# Patient Record
Sex: Male | Born: 1950 | Race: Black or African American | Hispanic: No | Marital: Single
Health system: Southern US, Community
[De-identification: ages and names within clinical notes are randomized; demographics above are authoritative.]

---

## 2008-08-09 ENCOUNTER — Inpatient Hospital Stay: Payer: Self-pay | Admitting: Internal Medicine

## 2008-09-15 ENCOUNTER — Emergency Department: Payer: Self-pay | Admitting: Emergency Medicine

## 2008-11-11 ENCOUNTER — Ambulatory Visit: Payer: Self-pay | Admitting: Nephrology

## 2009-03-31 ENCOUNTER — Ambulatory Visit: Payer: Self-pay | Admitting: Nephrology

## 2009-03-31 ENCOUNTER — Emergency Department: Payer: Self-pay | Admitting: Emergency Medicine

## 2009-04-08 ENCOUNTER — Ambulatory Visit: Payer: Self-pay | Admitting: Nephrology

## 2009-04-22 ENCOUNTER — Ambulatory Visit: Payer: Self-pay | Admitting: Vascular Surgery

## 2009-05-05 ENCOUNTER — Emergency Department: Payer: Self-pay | Admitting: Unknown Physician Specialty

## 2009-06-10 ENCOUNTER — Ambulatory Visit: Payer: Self-pay | Admitting: Vascular Surgery

## 2009-06-23 ENCOUNTER — Inpatient Hospital Stay: Payer: Self-pay | Admitting: Internal Medicine

## 2009-07-24 ENCOUNTER — Emergency Department: Payer: Self-pay | Admitting: Emergency Medicine

## 2009-08-06 ENCOUNTER — Emergency Department: Payer: Self-pay | Admitting: Emergency Medicine

## 2009-08-09 ENCOUNTER — Ambulatory Visit: Payer: Self-pay | Admitting: Emergency Medicine

## 2009-08-22 ENCOUNTER — Inpatient Hospital Stay: Payer: Self-pay | Admitting: Internal Medicine

## 2009-09-09 ENCOUNTER — Ambulatory Visit: Payer: Self-pay | Admitting: Orthopedic Surgery

## 2009-11-05 ENCOUNTER — Inpatient Hospital Stay: Payer: Self-pay | Admitting: Internal Medicine

## 2009-11-15 ENCOUNTER — Inpatient Hospital Stay: Payer: Self-pay | Admitting: Internal Medicine

## 2009-11-22 ENCOUNTER — Ambulatory Visit: Payer: Self-pay | Admitting: Nephrology

## 2009-12-16 ENCOUNTER — Ambulatory Visit: Payer: Self-pay | Admitting: Podiatry

## 2010-01-03 ENCOUNTER — Ambulatory Visit: Payer: Self-pay | Admitting: Podiatry

## 2010-01-13 ENCOUNTER — Ambulatory Visit: Payer: Self-pay | Admitting: Internal Medicine

## 2010-01-18 ENCOUNTER — Ambulatory Visit: Payer: Self-pay | Admitting: Podiatry

## 2010-01-19 ENCOUNTER — Ambulatory Visit: Payer: Self-pay | Admitting: Podiatry

## 2010-01-27 ENCOUNTER — Inpatient Hospital Stay: Payer: Self-pay | Admitting: Internal Medicine

## 2010-03-18 ENCOUNTER — Inpatient Hospital Stay: Payer: Self-pay | Admitting: Internal Medicine

## 2010-05-08 ENCOUNTER — Emergency Department: Payer: Self-pay | Admitting: Internal Medicine

## 2010-05-09 ENCOUNTER — Inpatient Hospital Stay: Payer: Self-pay | Admitting: Internal Medicine

## 2010-05-24 ENCOUNTER — Ambulatory Visit: Payer: Self-pay | Admitting: Nephrology

## 2010-05-26 ENCOUNTER — Inpatient Hospital Stay: Payer: Self-pay | Admitting: Specialist

## 2010-06-08 ENCOUNTER — Ambulatory Visit: Payer: Self-pay

## 2010-06-15 ENCOUNTER — Ambulatory Visit: Payer: Self-pay

## 2010-06-15 LAB — PATHOLOGY REPORT

## 2010-06-23 ENCOUNTER — Ambulatory Visit: Payer: Self-pay

## 2010-06-28 ENCOUNTER — Ambulatory Visit: Payer: Self-pay

## 2010-07-05 ENCOUNTER — Ambulatory Visit: Payer: Self-pay

## 2010-07-14 ENCOUNTER — Ambulatory Visit: Payer: Self-pay

## 2010-07-26 ENCOUNTER — Ambulatory Visit: Payer: Self-pay

## 2010-08-02 ENCOUNTER — Ambulatory Visit: Payer: Self-pay

## 2010-08-22 ENCOUNTER — Encounter: Payer: Self-pay | Admitting: Orthopedic Surgery

## 2010-08-23 ENCOUNTER — Ambulatory Visit: Payer: Self-pay

## 2010-08-30 ENCOUNTER — Ambulatory Visit: Payer: Self-pay

## 2010-09-08 ENCOUNTER — Encounter: Payer: Self-pay | Admitting: Orthopedic Surgery

## 2010-09-13 ENCOUNTER — Ambulatory Visit: Payer: Self-pay

## 2010-09-15 ENCOUNTER — Ambulatory Visit: Payer: Self-pay | Admitting: Internal Medicine

## 2010-09-29 ENCOUNTER — Ambulatory Visit: Payer: Self-pay

## 2010-10-05 ENCOUNTER — Ambulatory Visit: Payer: Self-pay

## 2010-10-13 ENCOUNTER — Encounter: Payer: Self-pay | Admitting: Orthopedic Surgery

## 2010-10-31 ENCOUNTER — Ambulatory Visit: Payer: Self-pay | Admitting: Vascular Surgery

## 2010-11-09 ENCOUNTER — Encounter: Payer: Self-pay | Admitting: Orthopedic Surgery

## 2010-12-05 ENCOUNTER — Ambulatory Visit: Payer: Self-pay | Admitting: Nephrology

## 2010-12-21 ENCOUNTER — Emergency Department: Payer: Self-pay | Admitting: Unknown Physician Specialty

## 2011-02-07 ENCOUNTER — Ambulatory Visit: Payer: Self-pay | Admitting: Urology

## 2011-03-29 ENCOUNTER — Ambulatory Visit: Payer: Self-pay | Admitting: Surgery

## 2011-04-10 ENCOUNTER — Encounter: Payer: Self-pay | Admitting: Nurse Practitioner

## 2011-04-13 ENCOUNTER — Inpatient Hospital Stay: Payer: Self-pay | Admitting: Internal Medicine

## 2011-04-28 ENCOUNTER — Ambulatory Visit: Payer: Self-pay | Admitting: Surgery

## 2011-05-09 ENCOUNTER — Inpatient Hospital Stay: Payer: Self-pay | Admitting: Surgery

## 2011-05-22 ENCOUNTER — Inpatient Hospital Stay: Payer: Self-pay | Admitting: Internal Medicine

## 2011-07-03 ENCOUNTER — Ambulatory Visit: Payer: Self-pay | Admitting: Vascular Surgery

## 2011-07-04 ENCOUNTER — Other Ambulatory Visit: Payer: Self-pay | Admitting: Nephrology

## 2011-07-06 ENCOUNTER — Ambulatory Visit: Payer: Self-pay | Admitting: Vascular Surgery

## 2011-08-03 ENCOUNTER — Ambulatory Visit: Payer: Self-pay | Admitting: Internal Medicine

## 2011-08-14 ENCOUNTER — Inpatient Hospital Stay: Payer: Self-pay | Admitting: Surgery

## 2011-08-16 LAB — PATHOLOGY REPORT

## 2011-09-04 IMAGING — CR DG ELBOW COMPLETE 3+V*L*
1 series · 4 of 4 positions shown · non-contrast
Comparison: none

REASON FOR EXAM: pain/trauma
COMMENTS:

PROCEDURE:     DXR - DXR ELBOW LT COMP W/OBLIQUES  - November 08, 2009  [DATE]
RESULT:     There is no radiographic evidence of overt fracture or
dislocation. Mild anterior and posterior fat pad signs are appreciated which
suggests effusion. A radio-occult fracture cannot be excluded.

[Series 1: view not recorded · 0.17mm/px · 4 of 4 slices shown]
[im 1/4]
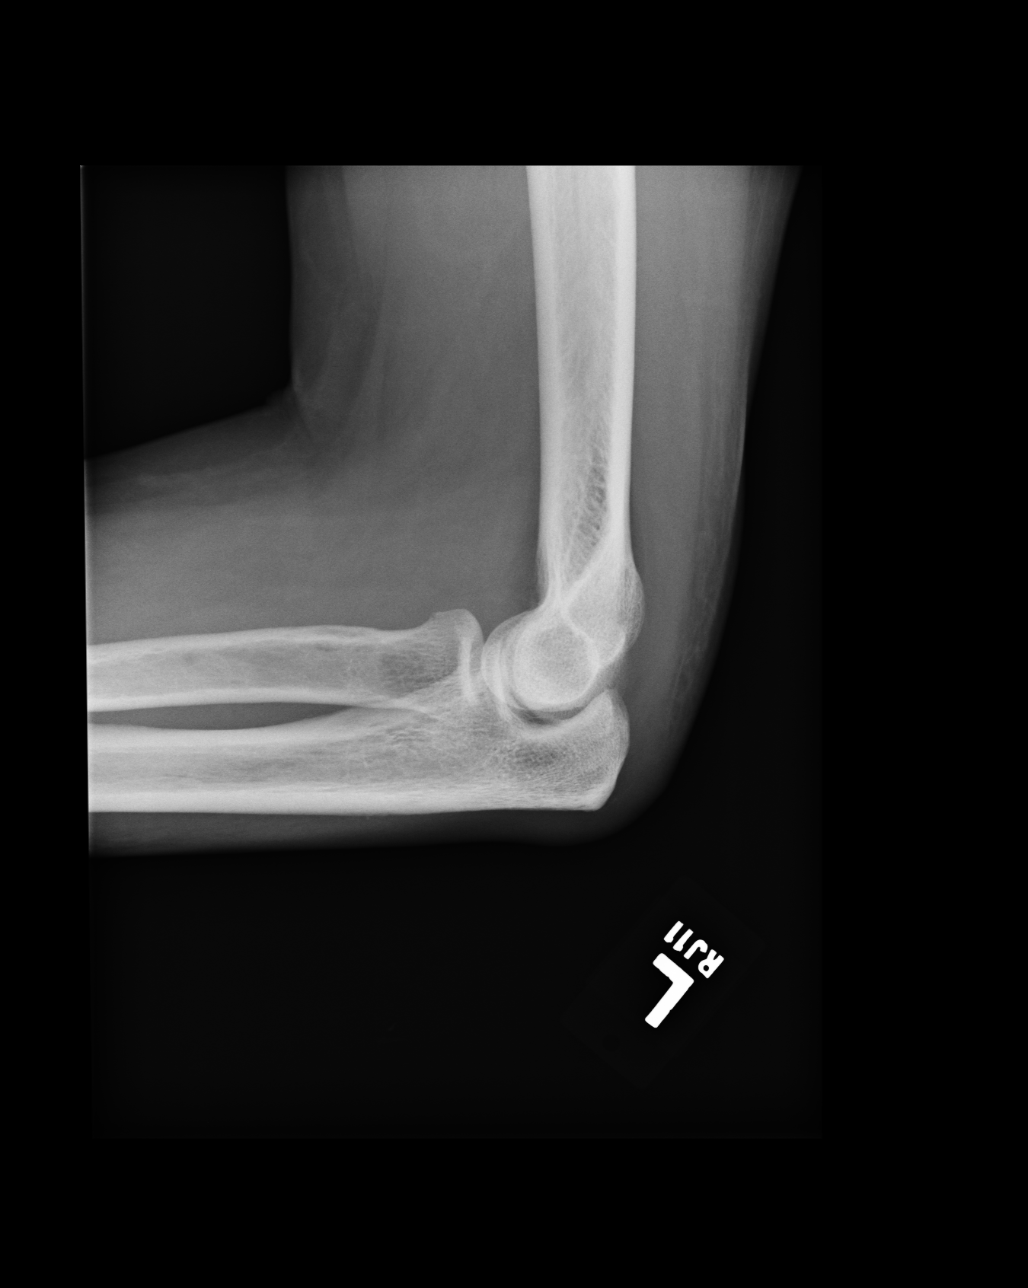
[im 2/4]
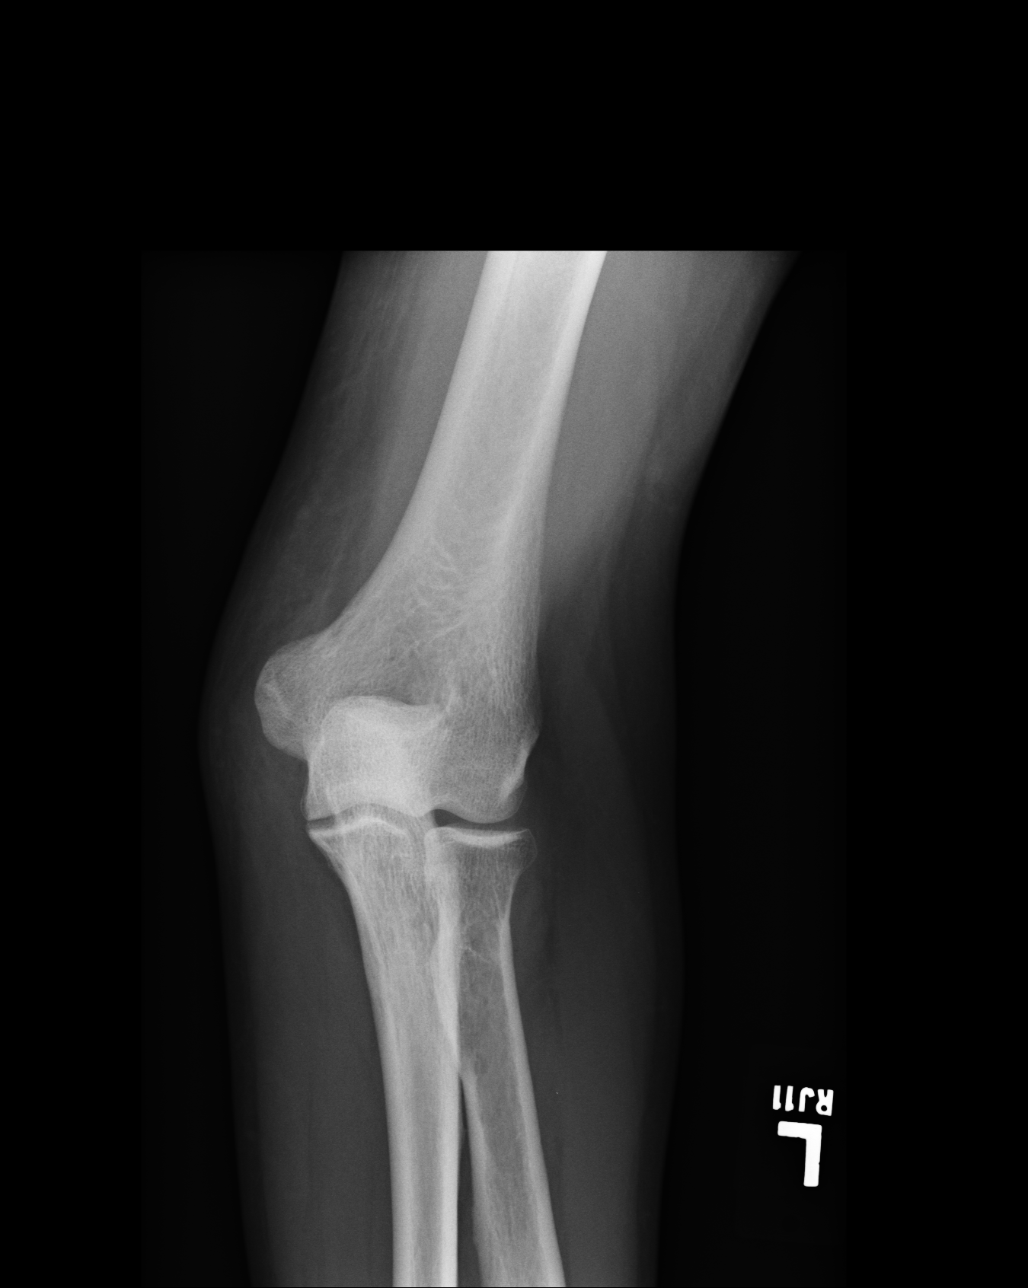
[im 3/4]
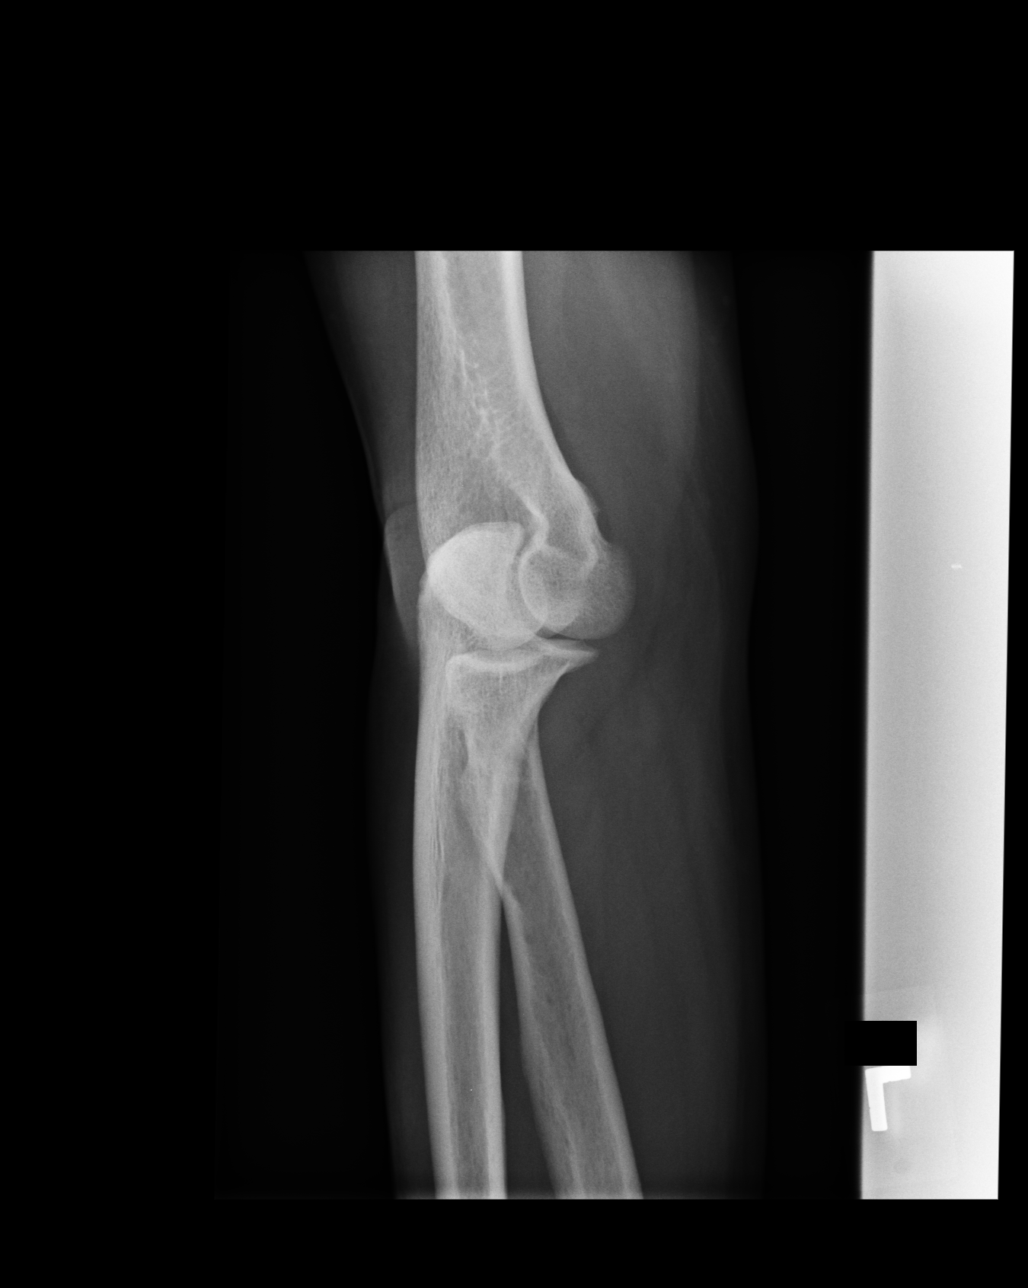
[im 4/4]
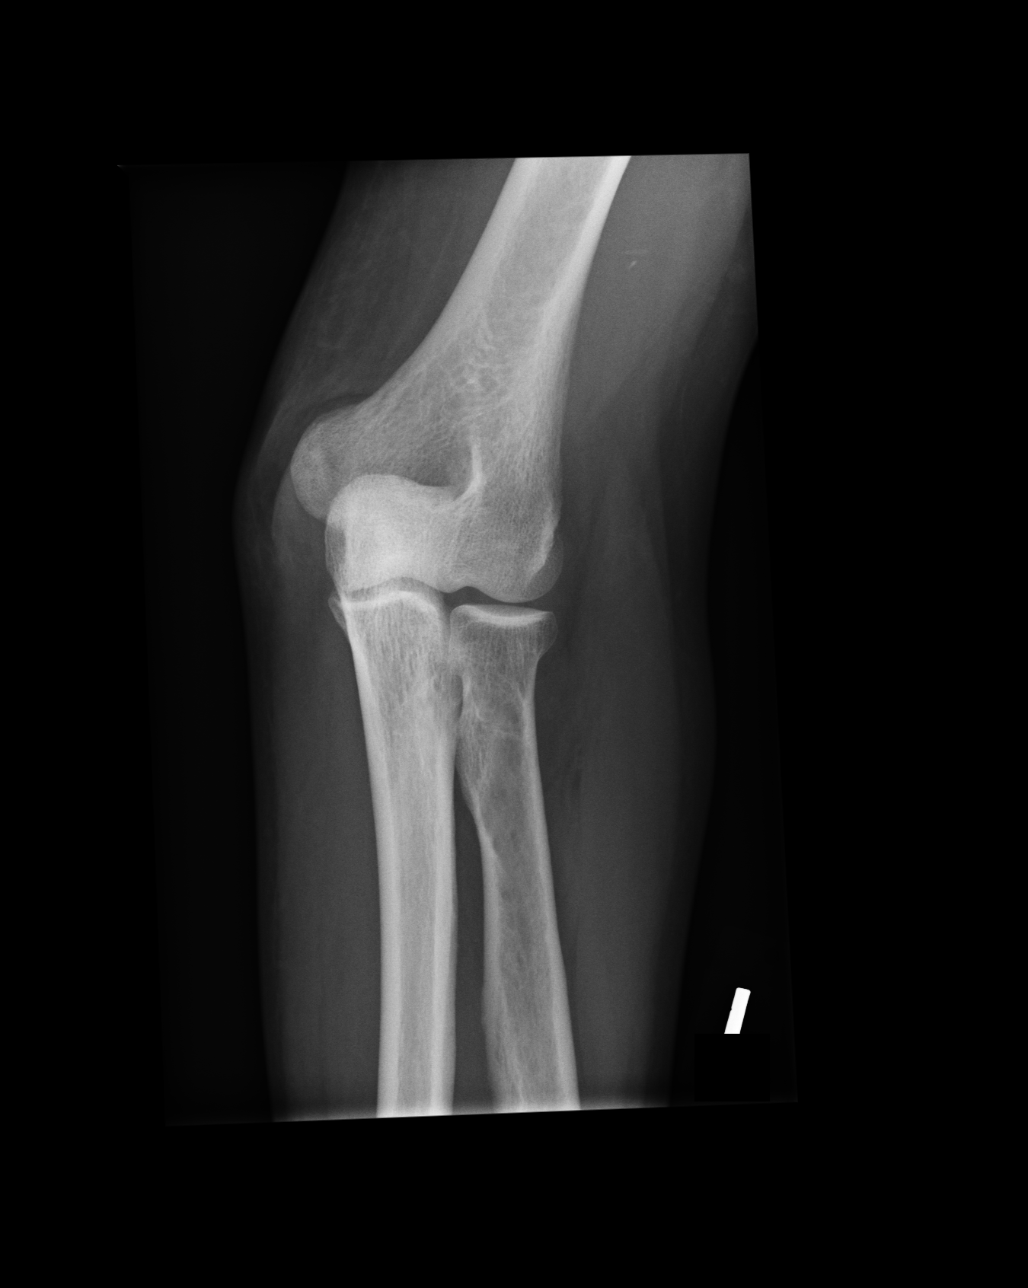

[4 of 4 positions shown; findings below may reference images not displayed]

IMPRESSION: No evidence of a radio-overt fracture. There does appear to
be findings consistent with a small joint effusion and a radio-occult
fracture cannot be excluded. If clinically warranted, repeat evaluation in
7-10 days is recommended.

## 2011-09-08 ENCOUNTER — Encounter: Payer: Self-pay | Admitting: Cardiothoracic Surgery

## 2011-09-08 ENCOUNTER — Encounter: Payer: Self-pay | Admitting: Nurse Practitioner

## 2011-09-09 ENCOUNTER — Encounter: Payer: Self-pay | Admitting: Nurse Practitioner

## 2011-09-09 ENCOUNTER — Encounter: Payer: Self-pay | Admitting: Cardiothoracic Surgery

## 2011-10-10 ENCOUNTER — Encounter: Payer: Self-pay | Admitting: Nurse Practitioner

## 2011-10-10 ENCOUNTER — Encounter: Payer: Self-pay | Admitting: Cardiothoracic Surgery

## 2011-10-19 ENCOUNTER — Ambulatory Visit: Payer: Self-pay | Admitting: Vascular Surgery

## 2011-11-03 ENCOUNTER — Ambulatory Visit: Payer: Self-pay | Admitting: Anesthesiology

## 2011-11-06 LAB — WOUND CULTURE

## 2011-11-08 ENCOUNTER — Ambulatory Visit: Payer: Self-pay | Admitting: Vascular Surgery

## 2011-11-08 LAB — POTASSIUM: Potassium: 4.6 mmol/L (ref 3.5–5.1)

## 2011-11-10 ENCOUNTER — Encounter: Payer: Self-pay | Admitting: Cardiothoracic Surgery

## 2011-11-10 ENCOUNTER — Encounter: Payer: Self-pay | Admitting: Nurse Practitioner

## 2011-12-08 ENCOUNTER — Encounter: Payer: Self-pay | Admitting: Nurse Practitioner

## 2011-12-11 ENCOUNTER — Ambulatory Visit: Payer: Self-pay | Admitting: Vascular Surgery

## 2011-12-12 ENCOUNTER — Encounter: Payer: Self-pay | Admitting: Cardiothoracic Surgery

## 2012-07-19 ENCOUNTER — Emergency Department: Payer: Self-pay | Admitting: Emergency Medicine

## 2012-07-19 LAB — CBC
HCT: 39.4 % — ABNORMAL LOW (ref 40.0–52.0)
MCHC: 34.5 g/dL (ref 32.0–36.0)
MCV: 104 fL — ABNORMAL HIGH (ref 80–100)
Platelet: 162 10*3/uL (ref 150–440)
RDW: 16.8 % — ABNORMAL HIGH (ref 11.5–14.5)
WBC: 5.5 10*3/uL (ref 3.8–10.6)

## 2012-07-19 LAB — COMPREHENSIVE METABOLIC PANEL
Albumin: 2.4 g/dL — ABNORMAL LOW (ref 3.4–5.0)
Anion Gap: 15 (ref 7–16)
Bilirubin,Total: 0.8 mg/dL (ref 0.2–1.0)
Calcium, Total: 8.3 mg/dL — ABNORMAL LOW (ref 8.5–10.1)
Co2: 24 mmol/L (ref 21–32)
Glucose: 88 mg/dL (ref 65–99)
Osmolality: 290 (ref 275–301)
Potassium: 5.8 mmol/L — ABNORMAL HIGH (ref 3.5–5.1)
SGPT (ALT): 18 U/L (ref 12–78)
Total Protein: 6 g/dL — ABNORMAL LOW (ref 6.4–8.2)

## 2012-12-10 ENCOUNTER — Inpatient Hospital Stay: Payer: Self-pay | Admitting: Internal Medicine

## 2012-12-10 LAB — BODY FLUID CELL COUNT WITH DIFFERENTIAL
Lymphocytes: 11 %
Neutrophils: 20 %
Nucleated Cell Count: 117 /mm3
Other Cells BF: 0 %
Other Mononuclear Cells: 68 %

## 2012-12-10 LAB — CBC WITH DIFFERENTIAL/PLATELET
Eosinophil #: 0 10*3/uL (ref 0.0–0.7)
Eosinophil %: 0.6 %
HGB: 9.4 g/dL — ABNORMAL LOW (ref 13.0–18.0)
Lymphocyte #: 0.5 10*3/uL — ABNORMAL LOW (ref 1.0–3.6)
Lymphocyte %: 7.1 %
MCH: 33.4 pg (ref 26.0–34.0)
MCV: 100 fL (ref 80–100)
Monocyte #: 0.4 x10 3/mm (ref 0.2–1.0)
Monocyte %: 5.2 %
Neutrophil %: 86.3 %
Platelet: 155 10*3/uL (ref 150–440)
RBC: 2.82 10*6/uL — ABNORMAL LOW (ref 4.40–5.90)
RDW: 17.7 % — ABNORMAL HIGH (ref 11.5–14.5)

## 2012-12-10 LAB — COMPREHENSIVE METABOLIC PANEL
Albumin: 2.5 g/dL — ABNORMAL LOW (ref 3.4–5.0)
Alkaline Phosphatase: 143 U/L — ABNORMAL HIGH (ref 50–136)
Anion Gap: 14 (ref 7–16)
BUN: 30 mg/dL — ABNORMAL HIGH (ref 7–18)
Bilirubin,Total: 1 mg/dL (ref 0.2–1.0)
Chloride: 99 mmol/L (ref 98–107)
Co2: 23 mmol/L (ref 21–32)
Creatinine: 8.92 mg/dL — ABNORMAL HIGH (ref 0.60–1.30)
EGFR (African American): 7 — ABNORMAL LOW
EGFR (Non-African Amer.): 6 — ABNORMAL LOW
Glucose: 102 mg/dL — ABNORMAL HIGH (ref 65–99)
Potassium: 3 mmol/L — ABNORMAL LOW (ref 3.5–5.1)
SGPT (ALT): 31 U/L (ref 12–78)
Sodium: 136 mmol/L (ref 136–145)
Total Protein: 5.8 g/dL — ABNORMAL LOW (ref 6.4–8.2)

## 2012-12-10 LAB — URINALYSIS, COMPLETE
Blood: NEGATIVE
Glucose,UR: >=500 mg/dL (ref 0–75)
Ketone: NEGATIVE
Ph: 9 (ref 4.5–8.0)
Protein: 100
RBC,UR: 4 /HPF (ref 0–5)

## 2012-12-10 LAB — TROPONIN I
Troponin-I: 0.07 ng/mL — ABNORMAL HIGH
Troponin-I: 0.07 ng/mL — ABNORMAL HIGH

## 2012-12-10 LAB — CK: CK, Total: 25 U/L — ABNORMAL LOW (ref 35–232)

## 2012-12-11 LAB — CBC WITH DIFFERENTIAL/PLATELET
Basophil #: 0.1 10*3/uL (ref 0.0–0.1)
HCT: 25.8 % — ABNORMAL LOW (ref 40.0–52.0)
HGB: 8.4 g/dL — ABNORMAL LOW (ref 13.0–18.0)
Lymphocyte %: 8.2 %
MCH: 32.8 pg (ref 26.0–34.0)
MCV: 100 fL (ref 80–100)
Monocyte #: 0.4 x10 3/mm (ref 0.2–1.0)
Platelet: 150 10*3/uL (ref 150–440)
RDW: 17.4 % — ABNORMAL HIGH (ref 11.5–14.5)
WBC: 6.3 10*3/uL (ref 3.8–10.6)

## 2012-12-11 LAB — BASIC METABOLIC PANEL
Anion Gap: 12 (ref 7–16)
BUN: 33 mg/dL — ABNORMAL HIGH (ref 7–18)
Co2: 23 mmol/L (ref 21–32)
EGFR (African American): 6 — ABNORMAL LOW
EGFR (Non-African Amer.): 5 — ABNORMAL LOW
Osmolality: 287 (ref 275–301)
Potassium: 3.4 mmol/L — ABNORMAL LOW (ref 3.5–5.1)
Sodium: 141 mmol/L (ref 136–145)

## 2012-12-11 LAB — TROPONIN I: Troponin-I: 0.06 ng/mL — ABNORMAL HIGH

## 2012-12-12 LAB — CBC WITH DIFFERENTIAL/PLATELET
Basophil #: 0 10*3/uL (ref 0.0–0.1)
Basophil %: 0.6 %
Eosinophil %: 0.9 %
HCT: 26 % — ABNORMAL LOW (ref 40.0–52.0)
HGB: 8.3 g/dL — ABNORMAL LOW (ref 13.0–18.0)
Lymphocyte #: 0.6 10*3/uL — ABNORMAL LOW (ref 1.0–3.6)
Lymphocyte %: 10.5 %
MCH: 31.6 pg (ref 26.0–34.0)
MCV: 100 fL (ref 80–100)
Neutrophil %: 79.8 %
RBC: 2.62 10*6/uL — ABNORMAL LOW (ref 4.40–5.90)
RDW: 17.5 % — ABNORMAL HIGH (ref 11.5–14.5)
WBC: 5.9 10*3/uL (ref 3.8–10.6)

## 2012-12-12 LAB — BASIC METABOLIC PANEL
Anion Gap: 13 (ref 7–16)
BUN: 36 mg/dL — ABNORMAL HIGH (ref 7–18)
Calcium, Total: 6.2 mg/dL — CL (ref 8.5–10.1)
Chloride: 106 mmol/L (ref 98–107)
Co2: 21 mmol/L (ref 21–32)
Potassium: 3.8 mmol/L (ref 3.5–5.1)
Sodium: 140 mmol/L (ref 136–145)

## 2012-12-12 LAB — MAGNESIUM: Magnesium: 1.5 mg/dL — ABNORMAL LOW

## 2012-12-12 LAB — CLOSTRIDIUM DIFFICILE BY PCR

## 2012-12-12 LAB — VANCOMYCIN, TROUGH: Vancomycin, Trough: 11 ug/mL (ref 10–20)

## 2012-12-13 LAB — BODY FLUID CELL COUNT WITH DIFFERENTIAL
Neutrophils: 10 %
Nucleated Cell Count: 69 /mm3
Other Cells BF: 0 %
Other Mononuclear Cells: 73 %

## 2012-12-13 LAB — CBC WITH DIFFERENTIAL/PLATELET
Basophil #: 0 10*3/uL (ref 0.0–0.1)
Basophil %: 0.5 %
Lymphocyte #: 0.5 10*3/uL — ABNORMAL LOW (ref 1.0–3.6)
Lymphocyte %: 7.8 %
MCHC: 32.4 g/dL (ref 32.0–36.0)
Monocyte #: 0.4 x10 3/mm (ref 0.2–1.0)
Monocyte %: 7.4 %
Neutrophil #: 5 10*3/uL (ref 1.4–6.5)
RBC: 2.99 10*6/uL — ABNORMAL LOW (ref 4.40–5.90)

## 2012-12-13 LAB — BASIC METABOLIC PANEL
Anion Gap: 17 — ABNORMAL HIGH (ref 7–16)
BUN: 36 mg/dL — ABNORMAL HIGH (ref 7–18)
Calcium, Total: 6.7 mg/dL — CL (ref 8.5–10.1)
Chloride: 102 mmol/L (ref 98–107)
Co2: 17 mmol/L — ABNORMAL LOW (ref 21–32)
EGFR (African American): 6 — ABNORMAL LOW
EGFR (Non-African Amer.): 5 — ABNORMAL LOW
Glucose: 86 mg/dL (ref 65–99)
Potassium: 3.5 mmol/L (ref 3.5–5.1)

## 2012-12-13 LAB — MAGNESIUM: Magnesium: 1.6 mg/dL — ABNORMAL LOW

## 2012-12-14 LAB — CBC WITH DIFFERENTIAL/PLATELET
Basophil #: 0 10*3/uL (ref 0.0–0.1)
Basophil %: 0.3 %
Eosinophil #: 0 10*3/uL (ref 0.0–0.7)
Eosinophil %: 0.8 %
MCH: 33.7 pg (ref 26.0–34.0)
MCV: 98 fL (ref 80–100)
Monocyte %: 9.9 %
Neutrophil #: 4.6 10*3/uL (ref 1.4–6.5)
Platelet: 177 10*3/uL (ref 150–440)
RBC: 2.71 10*6/uL — ABNORMAL LOW (ref 4.40–5.90)

## 2012-12-14 LAB — BASIC METABOLIC PANEL
Anion Gap: 13 (ref 7–16)
BUN: 38 mg/dL — ABNORMAL HIGH (ref 7–18)
Chloride: 100 mmol/L (ref 98–107)
Co2: 23 mmol/L (ref 21–32)
Creatinine: 9.97 mg/dL — ABNORMAL HIGH (ref 0.60–1.30)
EGFR (African American): 6 — ABNORMAL LOW
Osmolality: 280 (ref 275–301)
Potassium: 3.8 mmol/L (ref 3.5–5.1)

## 2012-12-14 LAB — VANCOMYCIN, RANDOM: Vancomycin, Random: 21 ug/mL

## 2012-12-15 LAB — BASIC METABOLIC PANEL
Anion Gap: 15 (ref 7–16)
BUN: 40 mg/dL — ABNORMAL HIGH (ref 7–18)
Calcium, Total: 7.7 mg/dL — ABNORMAL LOW (ref 8.5–10.1)
Chloride: 98 mmol/L (ref 98–107)
Co2: 22 mmol/L (ref 21–32)
Creatinine: 9.28 mg/dL — ABNORMAL HIGH (ref 0.60–1.30)
EGFR (African American): 6 — ABNORMAL LOW
Glucose: 87 mg/dL (ref 65–99)
Osmolality: 279 (ref 275–301)
Potassium: 3.8 mmol/L (ref 3.5–5.1)
Sodium: 135 mmol/L — ABNORMAL LOW (ref 136–145)

## 2012-12-15 LAB — MAGNESIUM: Magnesium: 1.6 mg/dL — ABNORMAL LOW

## 2012-12-16 LAB — BODY FLUID CULTURE

## 2012-12-16 LAB — BASIC METABOLIC PANEL
BUN: 34 mg/dL — ABNORMAL HIGH (ref 7–18)
Calcium, Total: 7.6 mg/dL — ABNORMAL LOW (ref 8.5–10.1)
Chloride: 96 mmol/L — ABNORMAL LOW (ref 98–107)
EGFR (Non-African Amer.): 6 — ABNORMAL LOW
Glucose: 73 mg/dL (ref 65–99)
Potassium: 3.7 mmol/L (ref 3.5–5.1)
Sodium: 134 mmol/L — ABNORMAL LOW (ref 136–145)

## 2012-12-16 LAB — PHOSPHORUS: Phosphorus: 2.6 mg/dL (ref 2.5–4.9)

## 2012-12-16 LAB — CULTURE, BLOOD (SINGLE)

## 2012-12-17 LAB — BODY FLUID CULTURE

## 2012-12-26 ENCOUNTER — Other Ambulatory Visit: Payer: Self-pay | Admitting: Nephrology

## 2012-12-26 LAB — POTASSIUM: Potassium: 4 mmol/L (ref 3.5–5.1)

## 2013-01-22 IMAGING — NM NM PARTHYROID
1 series · 12 of 14 positions shown, 15 images · non-contrast
Comparison: none

REASON FOR EXAM: secondary hyperparathyrodism
COMMENTS:

PROCEDURE:     NM  - NM  PARATHYROID IMAGE 2 HR    [DATE]  [DATE]
RESULT:     Comparison: None.
Radiopharmaceutical: 25.117 mCi technetium 99m sestamibi.
TECHNIQUE: After the injection of radio tracer, planar and oblique images
obtained of the neck and upper chest at 10 minutes. Then delayed SPECT CT
images were obtained approximately 3 hours.

[Series 3: neckroutine 3.0 b31s · axial · 0.98mm/px · z∈[+1581,+1778]mm · 12 of 155 slices shown, 15 images]
[im 12/155  soft-tissue]
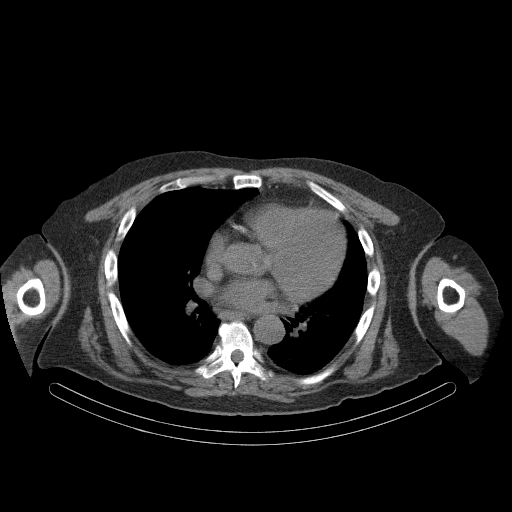
[im 12/155  bone]
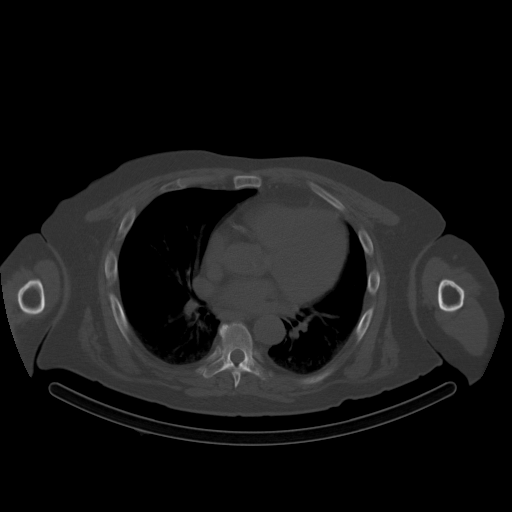
[im 24/155  bone]
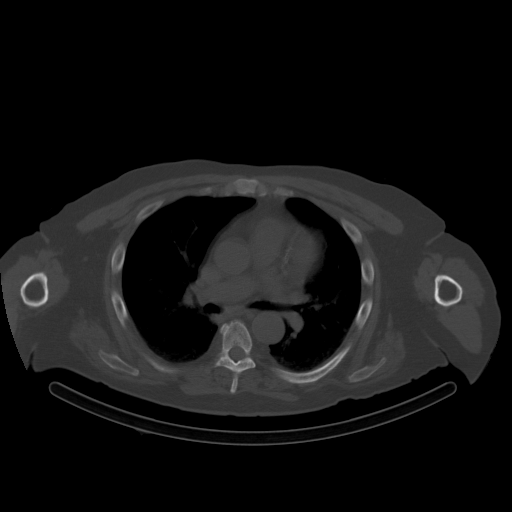
[im 36/155  bone]
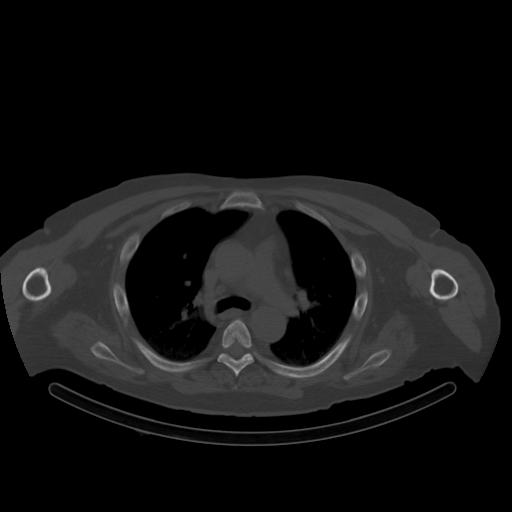
[im 48/155  bone]
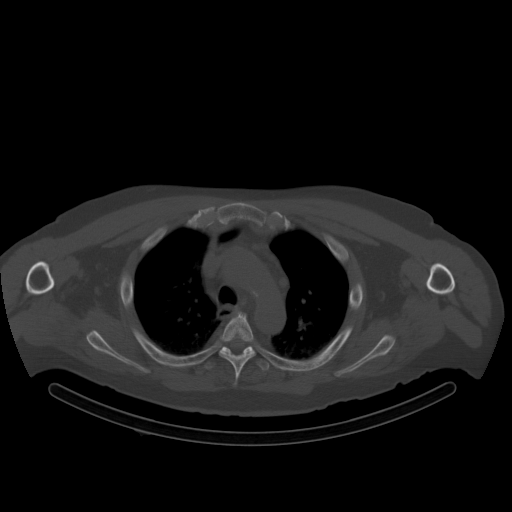
[im 60/155  soft-tissue]
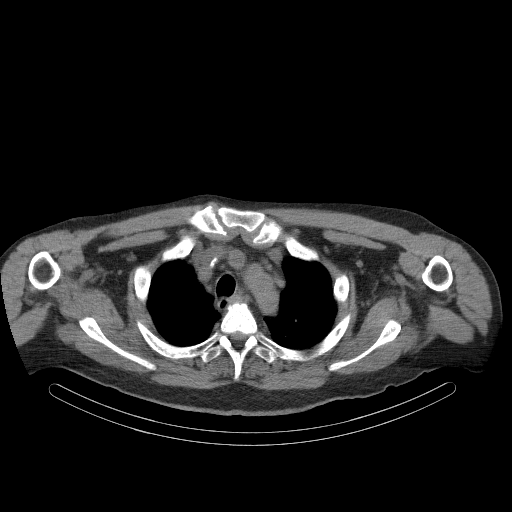
[im 60/155  bone]
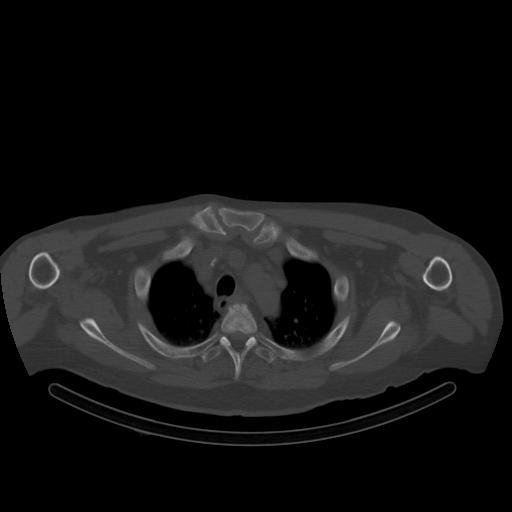
[im 72/155  bone]
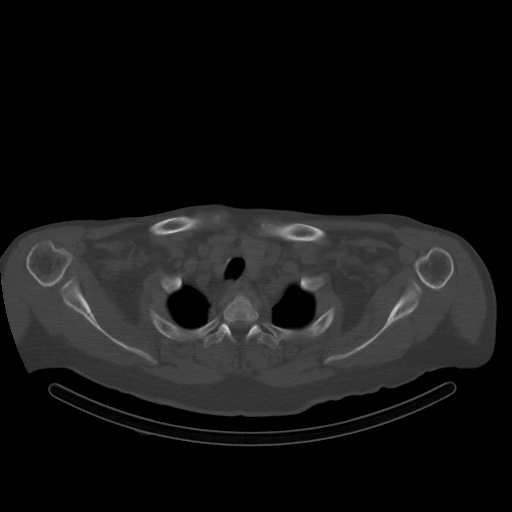
[im 83/155  bone]
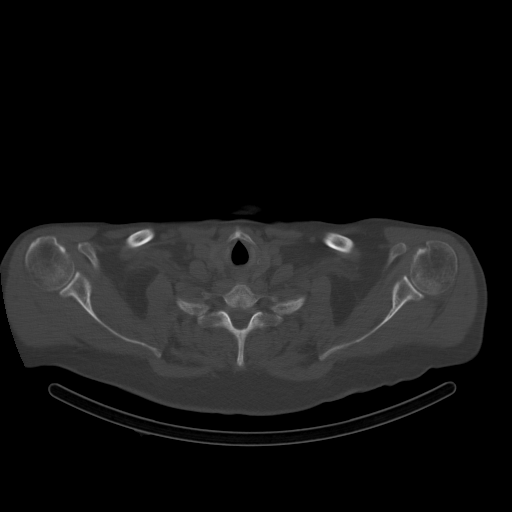
[im 95/155  bone]
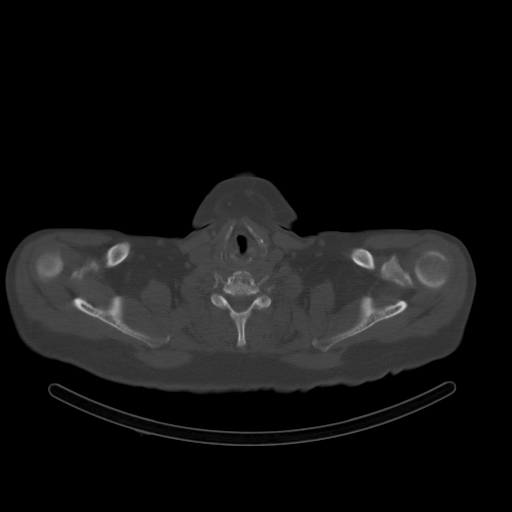
[im 107/155  soft-tissue]
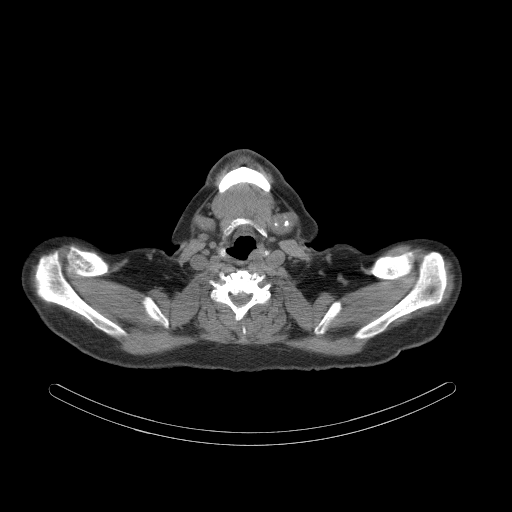
[im 107/155  bone]
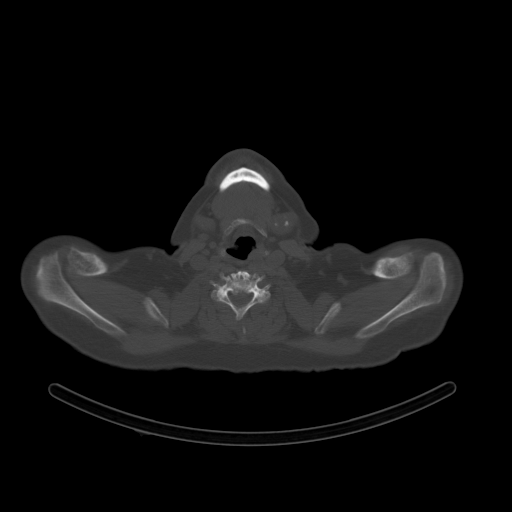
[im 119/155  bone]
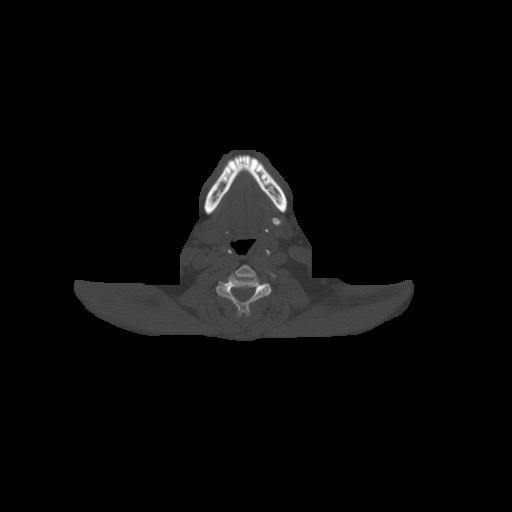
[im 131/155  bone]
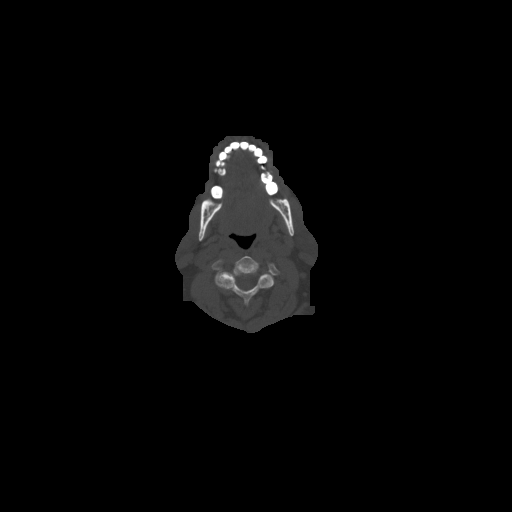
[im 143/155  bone]
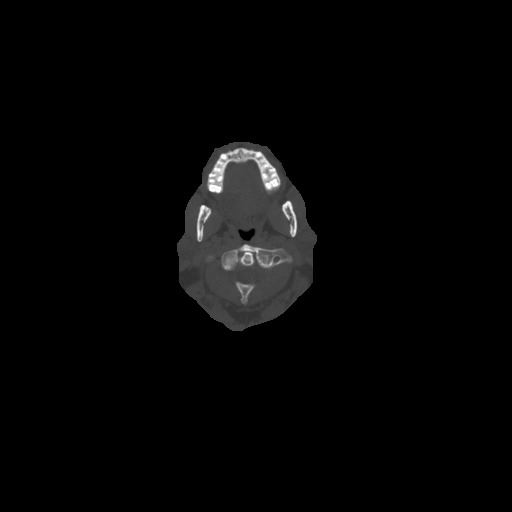

[12 of 14 positions shown; findings below may reference images not displayed]

FINDINGS: On the initial planar images, there is symmetric and diffuse radiotracer
activity in the thyroid gland. Radiotracer activity is demonstrated in the
salivary glands.

On the delayed SPECT CT images, there is a focal area of persistent
radiotracer activity just inferior to the right thyroid lobe. This
correlates with a small, 7-8 mm low-attenuation nodule just inferior to the
right lobe of the thyroid. This is seen on image 87 of the axial CT images.

There are multiple calcifications in the submandibular glands, the largest
of which is seen on the left which measures 1.3 x 1.0 cm. There are
calcification the coronary arteries.

No aggressive lytic or sclerotic osseous lesions identified.
IMPRESSION: 1. Findings consistent with a parathyroid adenoma, on the right, just
inferior to the right lobe of the thyroid.
2. Multiple calculi in the bilateral salivary glands, left greater than
right.

## 2013-02-04 ENCOUNTER — Ambulatory Visit: Payer: Self-pay | Admitting: Vascular Surgery

## 2013-05-12 ENCOUNTER — Inpatient Hospital Stay: Payer: Self-pay | Admitting: Internal Medicine

## 2013-05-12 LAB — COMPREHENSIVE METABOLIC PANEL
Albumin: 2.9 g/dL — ABNORMAL LOW (ref 3.4–5.0)
Alkaline Phosphatase: 122 U/L (ref 50–136)
Anion Gap: 13 (ref 7–16)
BUN: 41 mg/dL — ABNORMAL HIGH (ref 7–18)
Bilirubin,Total: 0.6 mg/dL (ref 0.2–1.0)
Chloride: 104 mmol/L (ref 98–107)
EGFR (African American): 7 — ABNORMAL LOW
EGFR (Non-African Amer.): 6 — ABNORMAL LOW
Potassium: 3.5 mmol/L (ref 3.5–5.1)
SGOT(AST): 62 U/L — ABNORMAL HIGH (ref 15–37)
SGPT (ALT): 66 U/L (ref 12–78)
Sodium: 141 mmol/L (ref 136–145)

## 2013-05-12 LAB — TROPONIN I: Troponin-I: 0.09 ng/mL — ABNORMAL HIGH

## 2013-05-12 LAB — CBC
HCT: 46.3 % (ref 40.0–52.0)
HGB: 15.3 g/dL (ref 13.0–18.0)
MCHC: 33 g/dL (ref 32.0–36.0)
MCV: 97 fL (ref 80–100)
RDW: 19.7 % — ABNORMAL HIGH (ref 11.5–14.5)

## 2013-05-12 LAB — CLOSTRIDIUM DIFFICILE BY PCR

## 2013-05-13 LAB — BASIC METABOLIC PANEL
Anion Gap: 12 (ref 7–16)
BUN: 46 mg/dL — ABNORMAL HIGH (ref 7–18)
Calcium, Total: 8 mg/dL — ABNORMAL LOW (ref 8.5–10.1)
Co2: 23 mmol/L (ref 21–32)
EGFR (African American): 6 — ABNORMAL LOW
Potassium: 4 mmol/L (ref 3.5–5.1)
Sodium: 143 mmol/L (ref 136–145)

## 2013-05-13 LAB — CBC WITH DIFFERENTIAL/PLATELET
Eosinophil #: 0 10*3/uL (ref 0.0–0.7)
Eosinophil %: 0.2 %
MCHC: 33.5 g/dL (ref 32.0–36.0)
Monocyte #: 1 x10 3/mm (ref 0.2–1.0)
Neutrophil #: 6.3 10*3/uL (ref 1.4–6.5)
Neutrophil %: 74 %
RBC: 4.11 10*6/uL — ABNORMAL LOW (ref 4.40–5.90)
RDW: 19.2 % — ABNORMAL HIGH (ref 11.5–14.5)

## 2013-05-14 LAB — RAPID HIV-1/2 QL/CONFIRM: HIV-1/2,Rapid Ql: NEGATIVE

## 2013-05-15 LAB — STOOL CULTURE

## 2013-05-18 LAB — CULTURE, BLOOD (SINGLE)

## 2013-05-19 LAB — PATHOLOGY REPORT

## 2013-05-27 ENCOUNTER — Ambulatory Visit: Payer: Self-pay | Admitting: Ophthalmology

## 2013-06-12 ENCOUNTER — Ambulatory Visit: Payer: Self-pay | Admitting: Vascular Surgery

## 2013-06-24 ENCOUNTER — Encounter: Payer: Self-pay | Admitting: Internal Medicine

## 2013-07-09 ENCOUNTER — Encounter: Payer: Self-pay | Admitting: Internal Medicine

## 2014-01-07 DEATH — deceased

## 2014-10-13 IMAGING — XA IR VASCULAR PROCEDURE
2 series · 3 of 3 positions shown · IV contrast (IODINE)
Comparison: none

[Series 1: venogram · 2 of 2 slices shown]
[im 1/2]
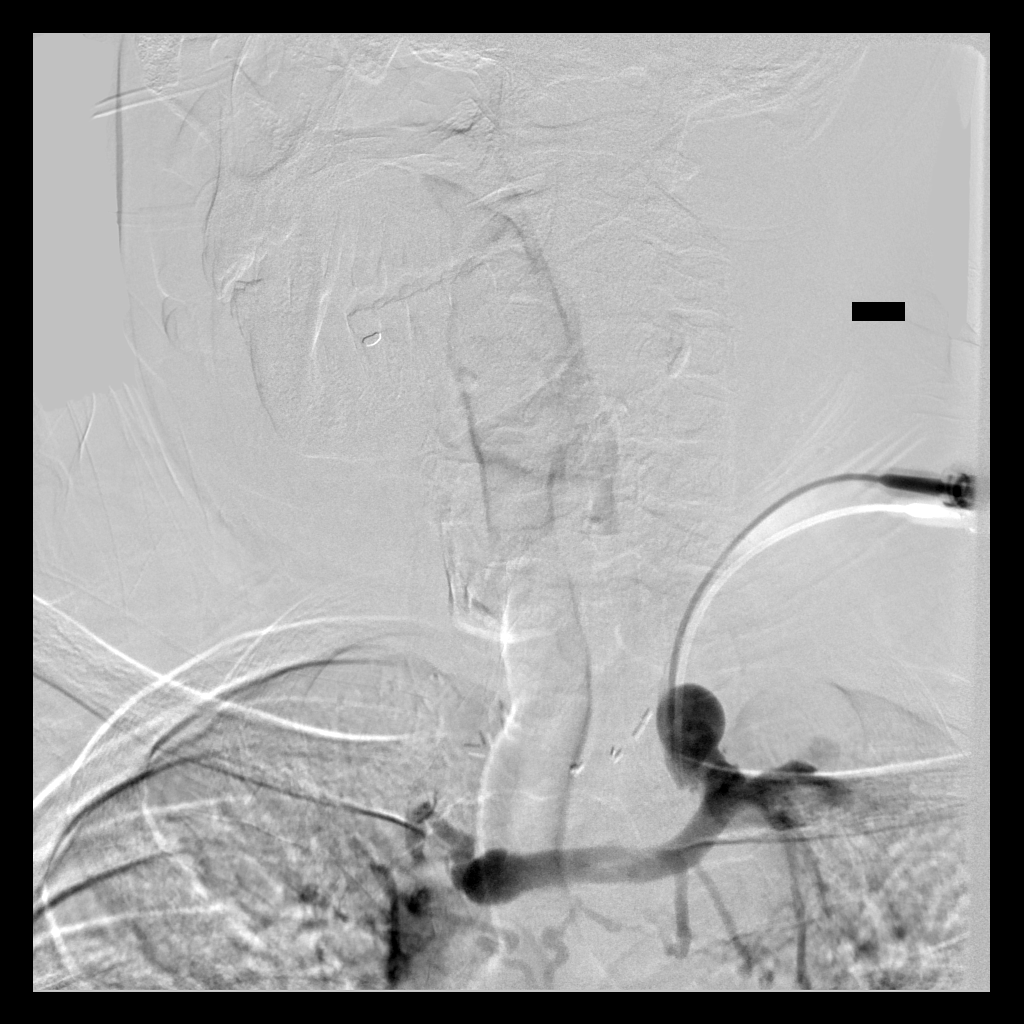
[im 2/2]
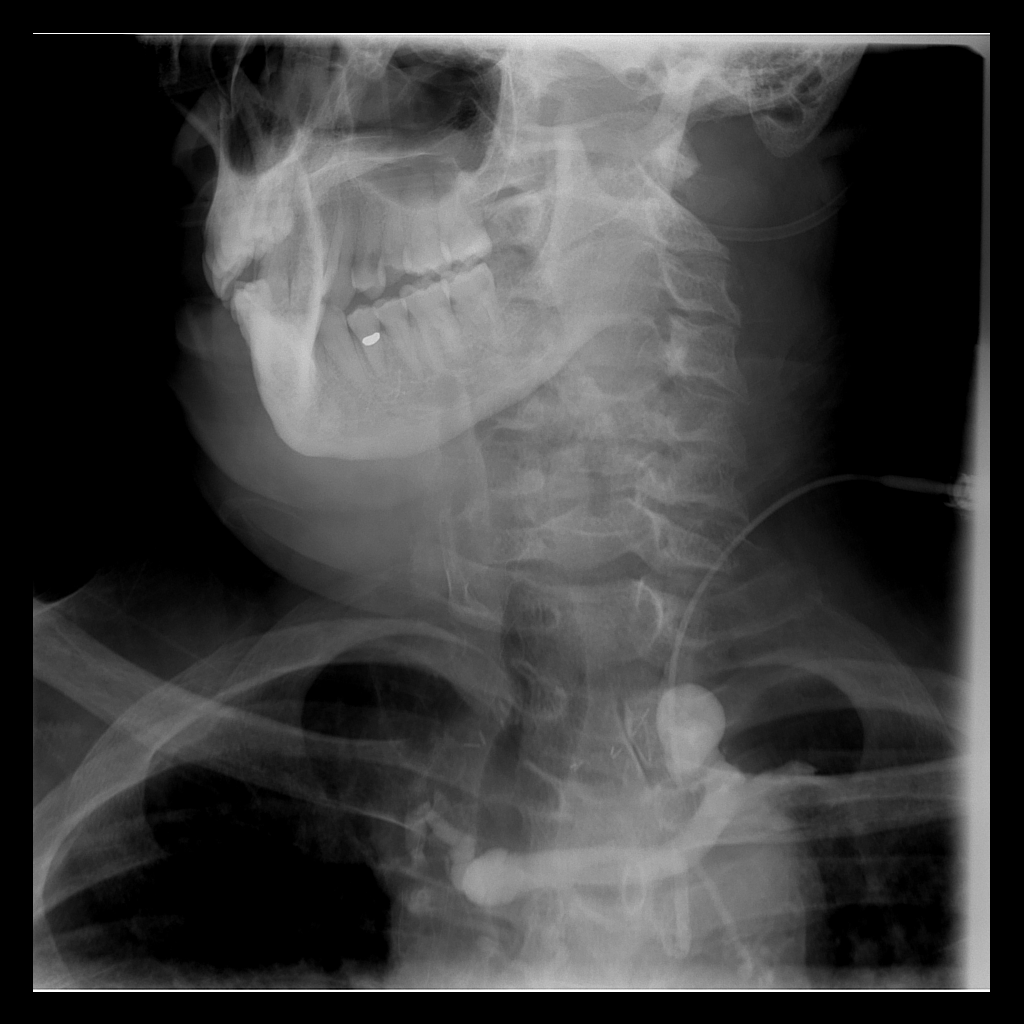

[Series 2: single · 1 of 1 slices shown]
[im 1/1]
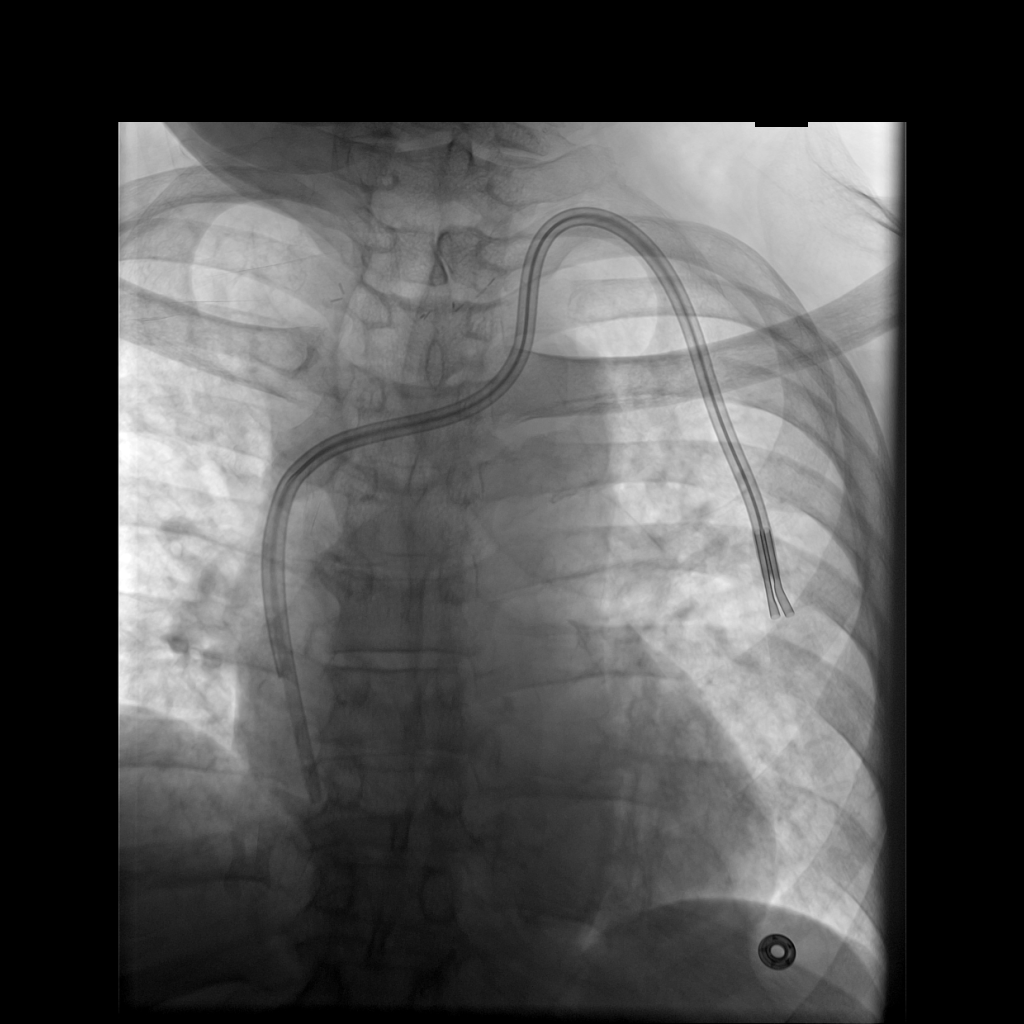

[3 of 3 positions shown; findings below may reference images not displayed]

IMAGES IMPORTED FROM THE [employer] WORKFLOW SYSTEM
NO DICTATION FOR STUDY

## 2015-01-29 NOTE — Consult Note (Signed)
Brief Consult Note: Diagnosis: diarrhea.   Patient was seen by consultant.   Consult note dictated.   Orders entered.   Discussed with Attending MD.   Comments: Patient seen and examined. Recurrent diarrhea in a [facility]. He is s/p abx therapy, most recently on oral Flagyl. Cdiff this hospitalization was negative. stool cx and blood cx are pending. stool o&p pending. Last colonoscopy was last year. Agree with Loperamide for now. Recc checking 48hr stool collection for quantity and fecal fat. Also will check TSH, Sed Rate, Fecal calprotectin.  If no improvement in diarrhea over the next day or so, will consider a Flex Sig vs colonoscopy.  Discussed case with Dr Shelle Ironein. Full consult being dicated.  Electronic Signatures: Ashok Cordiaarle, Shreyas Piatkowski M (PA-C)  (Signed 05-Aug-14 14:23)  Authored: Brief Consult Note   Last Updated: 05-Aug-14 14:23 by Ashok CordiaEarle, Macklin Jacquin M (PA-C)

## 2015-01-29 NOTE — H&P (Signed)
PATIENT NAME:  Alec Cox, Alec Cox MR#:  [id number] DATE OF BIRTH:  03/07/1951  DATE OF ADMISSION:  12/10/2012  PRIMARY CARE PHYSICIAN:  Dr. Ellsworth Lennox.  PRIMARY NEPHROLOGIST: Dr. Thedore Mins.   CHIEF COMPLAINT: Weakness and chills.   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 The patient has been feeling weak over the past 5 days. He also gives history of diarrhea of 4 to 5 times of liquid stools every day for 4 days. He had been continuing his peritoneal dialysis every evening. He did not record any fever, no shortness of breath, nausea or vomiting. The patient has been found to have hypotension with systolic blood pressure in the 70s in the Emergency Room, has received a liter of normal saline. CBC results are pending at this time. Lactic acid is 3.1. Peritoneal fluid sent for analysis shows 117 WBC with 20% neutrophils and 68% monocytes.   The patient was recently in [facility] for lesions on his finger and vasculitis. He went through plasmapheresis, is on prednisone. From what the wife describes, there is a possibility the patient was treated for cryoglobulinemia. He has been on prednisone 10 mg since his discharge a week back.   PAST MEDICAL HISTORY: 1.  End-stage renal disease on peritoneal dialysis, failed renal transplant in 2008.  2.  Hypertension but not on any medications now.  3.  Hepatitis C.  4.  Osteomyelitis of toe, status post amputation.  5.  Gastroparesis.  6.  Anemia of chronic kidney disease.   SOCIAL HISTORY: The patient lives at home with his wife. Ambulates with a walker. Does not smoke. No alcohol. No illicit drugs.   CODE STATUS: Full code.   FAMILY HISTORY: Positive for father with end-stage renal disease.   ALLERGIES: No known drug allergies.    HOME MEDICATIONS:  1.  Nexium 40 mg oral 2 times a day.  2.  Percocet 10/325, 1 tablet every 4 hours as needed.  3.  Potassium chloride 20 mEq oral once a day.  4.  Prednisone 10 mg oral once a day.  5.  Renvela 800 mg oral 3 times a day.  6.  Sodium bicarbonate 650 mg oral 2 times a day.   REVIEW OF SYSTEMS:  CONSTITUTIONAL: No fever but complains of fatigue and chills. No weight loss, weight gain.  EYES: No blurred vision, pain or redness.  ENT: No tinnitus, ear pain, hearing loss.  RESPIRATORY: No cough, wheeze or dyspnea.  CARDIOVASCULAR: No chest pain, edema.  GASTROINTESTINAL: No nausea, vomiting. Did have diarrhea for the past 4 days. No abdominal pain.  GENITOURINARY: Has end-stage renal disease, on peritoneal dialysis.  ENDOCRINE: No polyuria, nocturia or thyroid problems.  HEMATOLOGIC/LYMPHATIC: Has anemia of chronic kidney disease. No easy bruising, bleeding.  INTEGUMENTARY: Has gangrene in his fingers.  MUSCULOSKELETAL: No joint swelling, redness. No arthritis.  NEUROLOGIC: No focal numbness, weakness, seizures.  PSYCHIATRIC: No anxiety or depression.   PHYSICAL EXAMINATION: VITAL SIGNS: Temperature 97.2, pulse 112, blood pressure of 73/45 but as low as 64/44, saturating 97% on room air.  GENERAL: Moderately-built African American male patient lying in bed covered under multiple sheets with rigors.  PSYCHIATRIC: Alert and oriented x 3. Mood and affect appropriate. Judgment intact.  HEENT: Atraumatic, normocephalic. Oral mucosa dry and pink. No oral ulcers or thrush. External ears and  nose normal. Pallor positive. No icterus. Pupils bilaterally equal and reactive to light.  NECK: Supple. No thyromegaly. No palpable lymph nodes. Trachea midline. No carotid bruit or JVD.  CARDIOVASCULAR: S1, S2, without any murmurs. No edema.  RESPIRATORY: Normal work of breathing. Clear to auscultation on both sides.  GASTROINTESTINAL: Soft abdomen, nontender. Bowel sounds present, has a  peritoneal dialysis catheter. Scars from prior surgeries.  MUSCULOSKELETAL: No joint swelling, redness, effusion of the large joints. Normal muscle tone.  EXTREMITIES: Has dressing over a gangrenous area of his third finger on the right side.  NEUROLOGICAL: Motor strength 5 x 5 in upper and lower extremities. Sensation is intact all over.   LABORATORIES:   1.  Show glucose 102, BUN 30, creatinine 8.92, sodium 136, potassium 3, chloride 99, bicarb 23, calcium of 7.1 with albumin 2.5. AST, ALT, bilirubin normal. Troponin 0.07. CBC pending at this time.  2.  Peritoneal fluid shows 117 WBC with 20% neutrophils, 68% macrophages. Lactic acid of 3.1.  3.  EKG shows sinus tachycardia with occasional PVCs. No acute ST-T wave changes.   ASSESSMENT AND PLAN: 1.  Severe sepsis likely secondary from peritonitis or infection through diarrhea. The patient does have greater than 100 WBCs in the peritoneal fluid but less than 50% of neutrophils, but will treat as peritonitis with vancomycin and Zosyn to cover broadly. Get blood cultures and await cultures from the peritoneal fluid. We will also check for Clostridium difficile with his history of  diarrhea and stool WBC. The patient has not responded well to fluids. He will get another liter  bolus of normal saline. The patient will be admitted to Critical Care Unit. Presently tachycardic and hypotensive.  2.  Diarrhea. Check for Clostridium difficile stool WBC. This could have caused dehydration, contributing to his tachycardia and hypotension.  3.  End-stage renal disease, on peritoneal dialysis. Consult nephrology. The patient is presently waiting for a kidney transplant.  4.  Elevated troponin of 0.07. This could be demand ischemia with his hypotension in the setting of end-stage renal disease. May need outpatient stress test as he does not have any history of cardiac disease.  5.  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  The patient had recent plasmapheresis and is on  prednisone. [facility]. I have discussed with the lab regarding unable to obtain CBC results. The patient's blood needs to be checked under warm conditions did get good results. I discussed with lab.  6.  Deep venous thrombosis prophylaxis with heparin.   CODE STATUS: Full code.   TIME SPENT: Time spent today on this case with severe sepsis, hypotension, being admitted to CCU  was 50 minutes.    ____________________________ Molinda BailiffSrikar R. Sudini, MD srs:cs D: 12/10/2012 19:01:00 ET T: 12/10/2012 19:43:22 ET JOB#: [id number]  cc: Sheikh A. Ellsworth Lennoxejan-Sie, MD Dr. Thedore MinsSingh of nephrology Srikar R. Elpidio AnisSudini, MD, <Dictator>  Orie FishermanSRIKAR R SUDINI MD ELECTRONICALLY SIGNED 12/17/2012 15:16

## 2015-01-29 NOTE — Consult Note (Signed)
Impression:     64yo male w/ h/o ESRD on PD, chronic hepatitis C infection with cryoglobulinemia, right third finger gangrene admitted with diarrhea and hypotension.     His hypotension responded to IVF.  No currently on any pressors.  He has not had any diarrhea since admission.  His peritoneal fluid had increased number of cells, but there was not a neutrophilic predominance.  He does not clinically have peritonitis on exam.    I suspect that he had a viral illness that led to him becoming dehydrated.      Will wait another day for the cultures.  If they remain negative, will plan on stopping antibiotics.    To be transferred out of CCU today.     Given his lack of any diarrhea in house, C. diff is unlikely.    He is planning on starting hep C therapy in the near future and would like to transition to HD.    He has had stable gangrenous changes of his right third finger for several months.  He would like to explore amputation options.  Would consider surgical consultation.  Electronic Signatures: Blocker MPH, Rosalyn GessMichael E (MD)  (Signed on 06-Mar-14 14:57)  Authored  Last Updated: 06-Mar-14 14:57 by Blocker MPH, Rosalyn GessMichael E (MD)

## 2015-01-29 NOTE — Consult Note (Signed)
PATIENT NAME:  Alec Cox, Alec Cox MR#:  [id number] DATE OF BIRTH:  1951/08/03  DATE OF CONSULTATION:  05/13/2013  REFERRING PHYSICIAN:  Vivek J. Cherlynn KaiserSainani, MD CONSULTING PHYSICIAN:  Hardie ShackletonKaryn M. Yuniel Blaney, PA-C, for Dr. Trixie DeisMatthew Ryan  REASON FOR CONSULTATION: Diarrhea, recurrent, with a recent history of C. difficile.   HISTORY OF PRESENT ILLNESS: This is a 64 year old gentleman who initially presented to the hospital with concerns of recurrent persistent diarrhea. He is currently having a bowel movement 10 to 12 times per day and it is described as loose and watery. Of note, he was [facility] and had a confirmed a C. difficile infection. He was treated with vancomycin while inpatient, but his insurance would not cover oral vancomycin and therefore, he was switched to oral Flagyl. He completed this therapy 3 days ago and beginning about that same time, he began experiencing recurrence of his diarrhea. There is some mild abdominal cramping, but he denies any associated nausea, vomiting. There is no fever or chills. He still does have an appetite and there have been no unintentional weight changes. He denies any sick contacts or recent travel. He denies any recent antibiotic use outside of the C. difficile treatment. He does state that his last colonoscopy was approximately 1 year ago.   PAST MEDICAL HISTORY: Hepatitis C with cryoglobulinemia, end-stage renal disease on peritoneal hemodialysis, history of C. difficile, chronic pain syndrome and secondary hyperparathyroidism.   ALLERGIES: No known drug allergies.   SOCIAL HISTORY: The patient does have a remote history of tobacco use with a 30 pack-year history. Currently, he denies any alcohol, tobacco or illicit drug use.   FAMILY HISTORY: There is no known family history of GI malignancy, colon polyps or IBD.   HOME MEDICATIONS: [name], Baraclude, omeprazole, Percocet, potassium, prednisone, Reglan, Renvela, Silvadene and vitamin D2.    PAST SURGICAL HISTORY: Cholecystectomy and a kidney transplant in 2004 that failed.   REVIEW OF SYSTEMS: A 10-system review of systems was obtained on the patient. Pertinent positives are mentioned above and otherwise negative.   OBJECTIVE: VITAL SIGNS: Blood pressure 100/65, heart rate 96, respirations 18, temperature 99.5. Bedside pulse oximetry is 100%.  GENERAL: This is a pleasant 64 year old gentleman resting quietly and comfortably in bed, sitting up, eating a regular lunch, in no acute distress. Alert and oriented x 3.  HEAD: Atraumatic, normocephalic.  NECK: Supple. No lymphadenopathy noted.  HEENT: Sclerae anicteric. Mucous membranes moist.  LUNGS: Respirations are even and unlabored. Clear to auscultation bilateral anterior lung fields.  CARDIAC: Regular rate and rhythm. S1, S2 noted.  ABDOMEN: Soft, nontender, nondistended. Normoactive bowel sounds noted in all 4 quadrants. No guarding or rebound. No masses palpated.  RECTAL: Deferred.  PSYCHIATRIC: Appropriate mood and affect.  EXTREMITIES: Negative for lower extremity edema, 2+ pulses noted bilaterally.   LABORATORY DATA: White blood cells 8.6, hemoglobin 13.2, hematocrit 39.5, platelets 88. Troponin 0.09. Sodium 143, potassium 4, BUN 46, creatinine 9.66, glucose 84, bilirubin 0.6, alkaline phosphatase 122, AST 62, ALT 66, albumin 2.9.   ASSESSMENT: 1.  Recurrent diarrhea, 10 to 12 bowel movements per day, described as loose and watery.  2.  [facility], status post antibiotic therapy. Clostridium difficile was rechecked during this hospitalization and came back negative.  3.  History of end-stage renal disease, on peritoneal hemodialysis.  4.  Hepatitis C.   PLAN: I have discussed this patient'Cox case in detail with Dr. Trixie DeisMatthew Ryan, who is involved in the development of  the patient'Cox plan of care. At this time, we did review the repeat C. difficile test and this did come back  negative. Therefore, we do agree with the patient being placed on loperamide for his persistent diarrhea and we do recommend continuing supportive management. We are awaiting the blood cultures as well as stool cultures and ova and parasite. We do, however, recommend checking an ova and parasite x 3 to confirm accuracy. We will also include a sedimentation rate, a TSH, fecal calprotectin and a 48-hour stool collection to check for quantity and fecal fat. Certainly if the patient is still symptomatic, pending the above findings, we can certainly consider the patient for a flex sigmoidoscopy versus a colonoscopy to obtain luminal evaluation and colon biopsies. This was expressed to the patient, who verbalized understanding and is in agreement should this be clinically warranted. We will continue to monitor this patient closely, await the above lab findings and stool tests and make further recommendations per clinical course.   Thank you so much for this consultation and for allowing [place] to participate in the patient'Cox plan of care.   ATTENDING GASTROENTEROLOGIST: Dr. Trixie Deis.   ____________________________ Hardie Shackleton. Serina Nichter, PA-C kme:jm D: 05/13/2013 14:58:15 ET T: 05/13/2013 15:50:35 ET JOB#: [id number]  cc: Hardie Shackleton. Cyrah Mclamb, PA-C, <Dictator> Hardie Shackleton Lachandra Dettmann PA ELECTRONICALLY SIGNED 05/23/2013 13:35

## 2015-01-29 NOTE — Discharge Summary (Signed)
PATIENT NAME:  Alec Cox, [name] MR#:  [id number] DATE OF BIRTH:  07/08/1951  DATE OF ADMISSION:  12/10/2012 DATE OF DISCHARGE:  12/17/2012  DISCHARGE DIAGNOSES: 1.  Sepsis.  2.  Hypotension.  3.  Diarrhea.  4.  End-stage renal disease.  5.  Hepatitis C.  6.  Cryoglobulinemia, with ischemic digits.  7.  Chronic skin wounds.  8.  Hypertension.  9.  Chronic pain.   SECONDARY DIAGNOSES:  1.  Anemia of chronic disease.  2.  Osteomyelitis of the toe, status post amputation.  3.  Gastroparesis.   DISCHARGE MEDICATIONS:  1.  Prednisone 10 mg daily.  2.  Nexium 40 mg b.i.d.  3.  Baraclude 1 mL orally once a day. 4.  Sodium bicarbonate 1 tablet b.i.d.  5.  Potassium chloride 20 mEq once a day.  6.  Renvela carbonate 800 mg 3 times a day.  7.  Percocet 10/325, 1 tablet 4 to 6 hours p.r.n.  8.  (mucositis> 10 mL once a day as needed for sore mouth.   DISCHARGE INSTRUCTIONS:  DIET: Regular.   ACTIVITY: As tolerated.   FOLLOWUP: Dr. Ellsworth Lennoxejan-Sie, 1 to 2 weeks after discharge from skilled nursing facility.   CONSULTATIONS: Nephrology: Dr. Thedore MinsSingh, Vascular Surgery: Dr. Wyn Quakerew and Schnier, Infectious Disease: Dr. Orson AloeMichael Blocker.   HOSPITAL COURSE: This pleasant gentleman, well-known to me, was admitted through the Emergency Room complaining of weakness and chills, and diarrhea over the preceding 4 to  5 days. Please refer to the history and physical for full details; however, the patient's initial clinical presentation was suggestive for sepsis syndrome, with hypotension, which required admission to the intensive care unit. There, patient was bolused fluids. He, however, did not require inotropic therapy. His blood pressure responded quite well to fluids and normalized. Source of sepsis was unclear. He underwent a paracentesis. Peritoneal fluid did show elevated white cell count greater than 100, however less than 50% were neutrophils. Infectious Disease consultation was placed with Dr. Leavy CellaBlocker,  who did not think the patient had peritonitis contributing to his sepsis. He was pancultured, which was all negative, and eventually his sepsis was attributed to a possible viral syndrome. I did check his procalcitonin, which was modestly elevated, suggested systemic inflammation but not evidence of systemic inflammatory response syndrome. The patient's end-stage renal disease was managed by the Nephrology Service, Drs. Harmeet Singh and Campbell SoupMansour Lateef, who arranged continuation of his peritoneal dialysis. The patient was rather debilitated and was (evaluated  by physical therapy to require skilled nursing facility rehab. Due to this, a dialysis catheter was placed so the patient could undergo hemodialysis while he is a patient at the skilled nursing facility due to their inability to care for peritoneal dialysis patients. Initially during his stay, the patient was on broad-spectrum antibiotics; however, his white cell count remained normal throughout his stay. Dr. Leavy CellaBlocker discontinued this without any exacerbation of his white cell count or development of any fevers.   The patient's (middle finger> ischemic   digit which is due to his cryoglobulinemia, for which he [facility] recently. The patient had been advised that his finger would undergo (autoamputation; however, he requested interest in them having to amputate it as an inpatient, for which Dr. Festus BarrenJason Dew saw him, however he did not recommend amputation at this time and deferred further management to his hand surgeon [facility].   The patient requested that his diet be changed from renal to regular, which Nephrology agreed to, and  he has been placed on that in the hospital without any overt complications.   The patient's diarrhea subsided spontaneously. Stool culture did not grow any pathogens, and his presumed C. difficile was negative. The patient is being discharged to home in a satisfactory condition.    DISPOSITION: Skilled nursing facility.   DISCHARGE PROCESS TIME SPENT: 36 minutes.     ____________________________ Silas Flood Ellsworth Lennox, MD sat:dm D: 12/17/2012 13:35:54 ET T: 12/17/2012 14:36:11 ET JOB#: [id number]  cc: Marland Mcalpine A. Ellsworth Lennox, MD, <Dictator> Charlesetta Garibaldi MD ELECTRONICALLY SIGNED 12/24/2012 12:30

## 2015-01-29 NOTE — Consult Note (Signed)
CHIEF COMPLAINT and HISTORY:  Subjective/Chief Complaint weakness, digital gangrene   History of Present Illness ESRD patient admitted with weakness, possible dehydration.  Some concern for peritonitis but fluid not clearly consistent with peritonitis.  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 Other fingers warm with good capillary refill.  Has a normal radial pulse.  Never had an access on that side and is currently on PD, not HD.  Seeing a Copy at [facility] for this, but they were waiting on autoamputation and he is in pain and desires to have this removed.   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   venous ulcers:    hepatitis:    CHF:    Kidney failure:    Peritoneal Dialysis:    HTN:    Cholecystectomy:    Left foot surgery:    Hiatal hernia repair:    ventral hernia:    Kidney transplant 04:   ALLERGIES:  Allergies:  No Known Allergies:   HOME MEDICATIONS:  Home Medications: Medication Instructions Status  Percocet 10/325 325 mg-10 mg oral tablet 1 tab(s) orally every 4 to 6 hours Active  predniSONE 10 mg oral tablet 1 tab(s) orally once a day Active  Nexium 40 mg oral delayed release capsule 1 cap(s) orally 2 times a day Active  mucositis 10 milliliter(s) orally once a day, As Needed for sore mouth Active  Baraclude 1 milliliter(s) orally once a day Active  sodium bicarbonate 1 tab(s) orally 2 times a day Active  potassium chloride 20 mEq oral tablet, extended release 1 tab(s) orally once a day Active  Renvela carbonate 800 mg oral tablet 1 tab(s) orally 3 times a day Active   Family and Social History:  Family History Non-Contributory   Social History negative tobacco, negative ETOH, negative Illicit drugs   Place of Living Home   Review of Systems:  Subjective/Chief  Complaint diarrhea, weakness, digital gangrene   Fever/Chills No   Cough No   Sputum No   Abdominal Pain No   Diarrhea Yes   Constipation No   Nausea/Vomiting Yes   SOB/DOE No   Chest Pain No   Telemetry Reviewed NSR   Dysuria ESRD   Tolerating PT No   Tolerating Diet Yes   Medications/Allergies Reviewed Medications/Allergies reviewed   Physical Exam:  GEN well developed, well nourished   HEENT pink conjunctivae, hearing intact to voice   NECK No masses  trachea midline   RESP clear BS  no use of accessory muscles   CARD regular rate  no JVD   VASCULAR ACCESS PD catheter in place, no erythema or drainage   ABD normal BS  no Adominal Mass   GU ESRD patient   LYMPH negative neck, negative axillae   EXTR negative cyanosis/clubbing, negative edema   SKIN right third finger distal joint with dry gangrene, mummified.  No erythema or signs of infection.  other fingers well perfused   NEURO cranial nerves intact, motor/sensory function intact   PSYCH alert, A+O to time, place, person   Additional Comments radial pulses 2+ bilaterally   LABS:  Laboratory Results: BF Analysis:    04-Mar-14 16:30, Body Fluid Nucleated Cell Count, Diff  Body Fluid Source (CC)   PERITNL DIALYSATE  Color - (BF)   LIGHT YELLOW  Clarity (BF) CLEAR  NCC  (nucleated cell count)  117  Neutrophils  (BF) 20  Lymphocytes  (BF) 11  Monocytes/Macrophages  (BF) 68  Eosinophil  (BF) 1  Basophil  (BF) 0  Other Cells  (BF) 0  Result(s) reported on 10 Dec 2012 at 05:55PM.  Hepatic:    04-Mar-14 15:39, Comprehensive Metabolic Panel  Bilirubin, Total 1.0  Alkaline Phosphatase 143  SGPT (ALT) 31  SGOT (AST) 28  Total Protein, Serum 5.8  Albumin, Serum 2.5  TDMs:    06-Mar-14 03:46, Vancomycin, Trough LAB  Vancomycin, Trough LAB 11  Result(s) reported on 12 Dec 2012 at 04:24AM.  Routine BB:    05-Mar-14 10:20, Crossmatch 1 Unit  Crossmatch Unit 1   Transfused   Result(s)  reported on 12 Dec 2012 at 07:16AM.    05-Mar-14 10:20, Type and Antibody Screen  ABO Group + Rh Type   A Positive  Antibody Screen NEGATIVE  Result(s) reported on 11 Dec 2012 at 12:14PM.  Routine Micro:    04-Mar-14 15:39, Blood Culture  Micro Text Report   BLOOD CULTURE    COMMENT                   NO GROWTH IN 36 HOURS     ANTIBIOTIC  Culture Comment   NO GROWTH IN 36 HOURS   Result(s) reported on 12 Dec 2012 at 07:49AM.    04-Mar-14 16:00, Blood Culture  Micro Text Report   BLOOD CULTURE    COMMENT                   NO GROWTH IN 36 HOURS     ANTIBIOTIC  Culture Comment   NO GROWTH IN 36 HOURS   Result(s) reported on 12 Dec 2012 at 07:49AM.    04-Mar-14 16:30, Body Fluid Culture w/Smear  Micro Text Report   BODY FLUID CULTURE    COMMENT                   NO GROWTH IN 36 HOURS    GRAM STAIN                NO WHITE BLOOD CELLS    GRAM STAIN                RARE GRAM NEGATIVE ROD    GRAM STAIN                RARE GRAM POSITIVE COCCI     ANTIBIOTIC  Specimen Source   DIALYSATE  Culture Comment   NO GROWTH IN 36 HOURS  Gram Stain 1   NO WHITE BLOOD CELLS  Gram Stain 2   RARE GRAM NEGATIVE ROD  Gram Stain 3   RARE GRAM POSITIVE COCCI   Result(s) reported on 12 Dec 2012 at 07:49AM.    04-Mar-14 16:30, Urine Culture  Micro Text Report   URINE CULTURE    COMMENT                   NO GROWTH IN 36 HOURS     ANTIBIOTIC  Specimen Source   CLEAN CATCH  Culture Comment   NO GROWTH IN 36 HOURS   Result(s) reported on 12 Dec 2012 at 09:44AM.    05-Mar-14 14:52, Body Fluid Culture w/Smear  Micro Text Report   BODY FLUID CULTURE    COMMENT                   NO GROWTH IN 8-12 HOURS     ANTIBIOTIC  Specimen Source   sent in blood cul bo  Culture Comment   NO GROWTH IN 8-12 HOURS   Result(s) reported on 12 Dec 2012 at 07:33AM.  General Ref:    05-Mar-14 13:01, Cortisol, Serum RIA  Cortisol, Serum RIA   ========== TEST NAME ==========  ========= RESULTS =========  =  REFERENCE RANGE =    CORTISOL    Cortisol  Cortisol                        [H  23.9 ug/dL           ]          2.3-19.4                                         Cortisol AM         6.2 - 19.4                                         Cortisol PM         2.3 - 11.9                  [facility]            No: [id number]            [address], [place], [place] [address]            Lindon Romp, MD         [id number]     Result(s) reported on 12 Dec 2012 at 08:48AM.  Routine Chem:    04-Mar-14 15:39, CBC Profile  Result Comment   cbc - alot of fibrin   Result(s) reported on 10 Dec 2012 at 04:08PM.    04-Mar-14 15:39, Comprehensive Metabolic Panel  Glucose, Serum 102  BUN 30  Creatinine (comp) 8.92  Sodium, Serum 136  Potassium, Serum 3.0  Chloride, Serum 99  CO2, Serum 23  Calcium (Total), Serum 7.1  Osmolality (calc) 278  eGFR (African American) 7  eGFR (Non-African American) 6  eGFR values <40m/min/1.73 m2 may be an indication of chronic  kidney disease (CKD).  Calculated eGFR is useful in patients with stable renal function.  The eGFR calculation will not be reliable in acutely ill patients  when serum creatinine is changing rapidly. It is not useful in   patients on dialysis. The eGFR calculation may not be applicable  to patients at the low and high extremes of body sizes, pregnant  women, and vegetarians.  Anion Gap 14    04-Mar-14 15:39, Troponin I  Result Comment   troponin - RESULTS VERIFIED BY REPEAT TESTING.   - c/Debbie WThedore Mins_0 :25 12-10-12 by   - LJW   - READ-BACK PROCESS PERFORMED.   Result(s) reported on 10 Dec 2012 at 04:22PM.    04-Mar-14 17:15, CBC Profile  Result Comment   cbc - fibrin interference   Result(s) reported on 10 Dec 2012 at 05:45PM.    04-Mar-14 21:34, Troponin I  Result Comment   troponin - RESULTS VERIFIED BY REPEAT TESTING.   - previously called _1 :25 12-10-12 by LJW   Result(s) reported on 10 Dec 2012 at  10:22PM.    05-Mar-14 056:38 Basic Metabolic Panel (w/Total Calcium)  Glucose,  Serum 79  BUN 33  Creatinine (comp) 9.49  Sodium, Serum 141  Potassium, Serum 3.4  Chloride, Serum 106  CO2, Serum 23  Calcium (Total), Serum 6.1  Anion Gap 12  Osmolality (calc) 287  eGFR (African American) 6  eGFR (Non-African American) 5  eGFR values <52m/min/1.73 m2 may be an indication of chronic  kidney disease (CKD).  Calculated eGFR is useful in patients with stable renal function.  The eGFR calculation will not be reliable in acutely ill patients  when serum creatinine is changing rapidly. It is not useful in   patients on dialysis. The eGFR calculation may not be applicable  to patients at the low and high extremes of body sizes, pregnant  women, and vegetarians.  Result Comment   CALCIUM - RESULTS VERIFIED BY REPEAT TESTING.   - NOTIFIED OF CRITICAL VALUE   - [place] [address]  - 12/11/12-DAC   - READ-BACK PROCESS PERFORMED.   Result(s) reported on 11 Dec 2012 at 04:24AM.    05-Mar-14 04:01, Troponin I  Result Comment   TROPONIN - RESULTS VERIFIED BY REPEAT TESTING.   - RESULT CALLED PREVIOUSLY 12/10/12 AT   - 1625 BY LJW-DAC   Result(s) reported on 11 Dec 2012 at 06:25AM.    06-Mar-14 038:18 Basic Metabolic Panel (w/Total Calcium)  Glucose, Serum 82  BUN 36  Creatinine (comp) 10.25  Sodium, Serum 140  Potassium, Serum 3.8  Chloride, Serum 106  CO2, Serum 21  Calcium (Total), Serum 6.2  Anion Gap 13  Osmolality (calc) 287  eGFR (African American) 6  eGFR (Non-African American) 5  eGFR values <660mmin/1.73 m2 may be an indication of chronic  kidney disease (CKD).  Calculated eGFR is useful in patients with stable renal function.  The eGFR calculation will not be reliable in acutely ill patients  when serum creatinine is changing rapidly. It is not useful in   patients on dialysis. The eGFR calculation may not be applicable  to patients at the low and high  extremes of body sizes, pregnant  women, and vegetarians.  Result Comment   CALCIUM - NOTIFIED OF CRITICAL VALUE   - RESULTS VERIFIED BY REPEAT TESTING.   - CALLED TO TESS THOMAS:12/12/12_0    - READ-BACK PROCESS PERFORMED.   - TPL   Result(s) reported on 12 Dec 2012 at 04:09AM.    06-Mar-14 04:56, Magnesium, Serum  Magnesium, Serum 1.5  1.8-2.4  THERAPEUTIC RANGE: 4-7 mg/dL  TOXIC: > 10 mg/dL   -----------------------  Cardiac:    04-Mar-14 15:39, CK, Total  CK, Total 25  Result(s) reported on 10 Dec 2012 at 08:06PM.    04-Mar-14 15:39, Troponin I  Troponin I 0.07  0.00-0.05  0.05 ng/mL or less: NEGATIVE   Repeat testing in 3-6 hrs   if clinically indicated.  >0.05 ng/mL: POTENTIAL   MYOCARDIAL INJURY. Repeat   testing in 3-6 hrs if   clinically indicated.  NOTE: An increase or decrease   of 30% or more on serial   testing suggests a   clinically important change    04-Mar-14 21:34, Troponin I  Troponin I 0.07  0.00-0.05  0.05 ng/mL or less: NEGATIVE   Repeat testing in 3-6 hrs   if clinically indicated.  >0.05 ng/mL: POTENTIAL   MYOCARDIAL INJURY. Repeat   testing in 3-6 hrs if   clinically indicated.  NOTE: An increase or decrease   of 30% or more on serial   testing suggests a  clinically important change    05-Mar-14 04:01, Troponin I  Troponin I 0.06  0.00-0.05  0.05 ng/mL or less: NEGATIVE   Repeat testing in 3-6 hrs   if clinically indicated.  >0.05 ng/mL: POTENTIAL   MYOCARDIAL INJURY. Repeat   testing in 3-6 hrs if   clinically indicated.  NOTE: An increase or decrease   of 30% or more on serial   testing suggests a   clinically important change  Routine UA:    04-Mar-14 16:30, Urinalysis  Color (UA) Straw  Clarity (UA) Clear  Glucose (UA) >=500  Bilirubin (UA) Negative  Ketones (UA) Negative  Specific Gravity (UA) 1.003  Blood (UA) Negative  pH (UA) 9.0  Protein (UA)   100 mg/dL  Nitrite (UA) Negative  Leukocyte Esterase (UA)  Negative  Result(s) reported on 10 Dec 2012 at 06:45PM.  RBC (UA) 4 /HPF  WBC (UA) 11 /HPF  Bacteria (UA)   NONE SEEN  Epithelial Cells (UA)   NONE SEEN   Result(s) reported on 10 Dec 2012 at 06:45PM.  Routine Hem:    04-Mar-14 15:39, CBC Profile  WBC (CBC) -  RBC (CBC) -  Hemoglobin (CBC) -  Hematocrit (CBC) -  Platelet Count (CBC) -  MCV -  MCH -  MCHC -  RDW -  Neutrophil % -  Lymphocyte % -  Monocyte % -  Eosinophil % -  Basophil % -  Neutrophil # -  Lymphocyte # -  Monocyte # -  Eosinophil # -  Basophil # -  Bands -  Segmented Neutrophils -  Lymphocytes -  Variant Lymphocytes -  Monocytes -  Eosinophil -  Basophil -  Metamyelocyte -  Myelocyte -  Promyelocyte -  Blast-Like -  Other Cells -  NRBC -  Diff Comment 1 -  Diff Comment 2 -  Diff Comment 3 -  Diff Comment 4 -  Diff Comment 5 -  Diff Comment 6 -  Diff Comment 7 -  Diff Comment 8 -  Diff Comment 9 -  Diff Comment 10 -  Result(s) reported on 10 Dec 2012 at 04:08PM.    04-Mar-14 17:15, CBC Profile  WBC (CBC) -  RBC (CBC) -  Hemoglobin (CBC) -  Hematocrit (CBC) -  Platelet Count (CBC) -  MCV -  MCH -  MCHC -  RDW -  Neutrophil % -  Lymphocyte % -  Monocyte % -  Eosinophil % -  Basophil % -  Neutrophil # -  Lymphocyte # -  Monocyte # -  Eosinophil # -  Basophil # -  Bands -  Segmented Neutrophils -  Lymphocytes -  Variant Lymphocytes -  Monocytes -  Eosinophil -  Basophil -  Metamyelocyte -  Myelocyte -  Promyelocyte -  Blast-Like -  Other Cells -  NRBC -  Diff Comment 1 -  Diff Comment 2 -  Diff Comment 3 -  Diff Comment 4 -  Diff Comment 5 -  Diff Comment 6 -  Diff Comment 7 -  Diff Comment 8 -  Diff Comment 9 -  Diff Comment 10 -  Result(s) reported on 10 Dec 2012 at 05:45PM.    04-Mar-14 19:22, CBC Profile  WBC (CBC) 7.3  RBC (CBC) 2.82  Hemoglobin (CBC) 9.4  Hematocrit (CBC) 28.3  Platelet Count (CBC) 155  MCV 100  MCH 33.4  MCHC 33.3  RDW 17.7   Neutrophil % 86.3  Lymphocyte % 7.1  Monocyte % 5.2  Eosinophil % 0.6  Basophil % 0.8  Neutrophil # 6.3  Lymphocyte # 0.5  Monocyte # 0.4  Eosinophil # 0.0  Basophil # 0.1  Result(s) reported on 10 Dec 2012 at 07:37PM.    05-Mar-14 04:01, CBC Profile  WBC (CBC) 6.3  RBC (CBC) 2.57  Hemoglobin (CBC) 8.4  Hematocrit (CBC) 25.8  Platelet Count (CBC) 150  MCV 100  MCH 32.8  MCHC 32.7  RDW 17.4  Neutrophil % 83.4  Lymphocyte % 8.2  Monocyte % 6.3  Eosinophil % 1.3  Basophil % 0.8  Neutrophil # 5.3  Lymphocyte # 0.5  Monocyte # 0.4  Eosinophil # 0.1  Basophil # 0.1  Result(s) reported on 11 Dec 2012 at 04:24AM.    06-Mar-14 03:46, CBC Profile  WBC (CBC) 5.9  RBC (CBC) 2.62  Hemoglobin (CBC) 8.3  Hematocrit (CBC) 26.0  Platelet Count (CBC) 139  MCV 100  MCH 31.6  MCHC 31.8  RDW 17.5  Neutrophil % 79.8  Lymphocyte % 10.5  Monocyte % 8.2  Eosinophil % 0.9  Basophil % 0.6  Neutrophil # 4.7  Lymphocyte # 0.6  Monocyte # 0.5  Eosinophil # 0.1  Basophil # 0.0  Result(s) reported on 12 Dec 2012 at 04:09AM.   RADIOLOGY:  Radiology Results: XRay:    11-Oct-13 21:30, Chest PA and Lateral  Chest PA and Lateral  REASON FOR EXAM:    sob  COMMENTS:       PROCEDURE: DXR - DXR CHEST PA (OR AP) AND LATERAL  - Jul 19 2012  9:30PM     RESULT: Comparison: 05/22/2011    Findings:  Right IJ central venous catheter tip terminates over the SVC. Heart and   mediastinum are stable. No focal pulmonary opacities. Surgical clips are   seen from prior cholecystectomy.    IMPRESSION:   No acute cardiopulmonary disease.      Verified By: Gregor Hams, M.D., MD    04-Mar-14 16:04, Chest Portable Single View  Chest Portable Single View  REASON FOR EXAM:    sepsis  COMMENTS:       PROCEDURE: DXR - DXR PORTABLE CHEST SINGLE VIEW  - Dec 10 2012  4:04PM     RESULT: The lungs are clear. The cardiac silhouette and visualized bony   skeleton are unremarkable.    IMPRESSION:       1. Chest radiograph without evidence of acute cardiopulmonary disease.   2. Comparison made to prior study dated 07/19/2012        Verified By: Mikki Santee, M.D., MD  LabUnknown:  PACS Image   ASSESSMENT AND PLAN:  Assessment/AdmissionDiagnosis right third finger distal phalanx gangrene.  Dry, mummified.  No infection present.   No access site on that side and normal radial pulse at the wrist   Plan The large vessel perfusion appears normal and his other fingers are well perfused. This distal gangrenous finger is not infected, but could be amputated for pain relief.   If he desires this, may benefit from a hand surgeon to evaluate this or he can follow up with the hand surgeon at [facility] after discharge No other vascular recs   Level 4   Electronic Signatures: Algernon Huxley (MD)  (Signed 06-Mar-14 14:27)  Authored: Chief Complaint and History, PAST MEDICAL/SURGICAL HISTORY, ALLERGIES, HOME MEDICATIONS, Family and Social History, Review of Systems, Physical Exam, LABS, RADIOLOGY, Assessment and Plan   Last Updated: 06-Mar-14 14:27 by Algernon Huxley (MD)

## 2015-01-29 NOTE — Op Note (Signed)
PATIENT NAME:  Alec LocksMITCHELL, [name] MR#:  [id number] DATE OF BIRTH:  April 23, 1951  DATE OF PROCEDURE:  02/04/2013  PREOPERATIVE DIAGNOSES:  1. Complication arteriovenous dialysis device.  2. End-stage renal disease requiring hemodialysis.   POSTOPERATIVE DIAGNOSES:  1. Complication arteriovenous dialysis device.  2. End-stage renal disease requiring hemodialysis.   PROCEDURES PERFORMED:  1. TPA infusion for catheter clearance-unsuccessful.  2. Exchange of cuffed tunneled dialysis catheter over a wire, same venous access, with fluoroscopic guidance.   SURGEON: Renford DillsGregory G Schnier, MD   SEDATION: Versed 2 mg plus fentanyl 100 mcg administered IV. Continuous ECG, pulse oximetry and cardiopulmonary monitoring is performed throughout the entire procedure by the interventional radiology nurse. Total sedation time was 30 minutes.   ACCESS: Right IJ.   CONTRAST USED: None.   FLUOROSCOPY TIME: Less than 3 minutes.   INDICATIONS: The patient is a 64 year old gentleman who is referred from the dialysis center for difficulties with flow regarding his catheter and inability to do dialysis. Risks and benefits were reviewed, all questions answered, the patient has agreed to proceed.   DESCRIPTION OF PROCEDURE: The patient is taken to special procedures holding area where it is verified there is poor aspiration of his catheter. Both lumens forward flush easily.   Two aliquots of 2 mg of TPA in 50 mL are reconstituted and simultaneous infusion over 2-1/2 to 3 hours is performed through the tunneled catheter. At the conclusion, I personally aspirated both lumens. Arterial lumen aspirated quite easily, but the venous lumen would not aspirate at all. Therefore, TPA infusions unsuccessful and the patient is informed he will require exchange of the catheter.   The patient is then brought to special procedures and placed in the supine position. After adequate sedation is achieved, catheter is prepped and draped in  a sterile fashion. A small incision is created overlying the palpable cuff and the cuff is dissected free. Amplatz Super Stiff wire is then advanced through the catheter. The catheter is removed and a 19 cm tip to cuff Palindrome catheter is advanced over the wire without difficulty. Fluoroscopy is used to position the tip at the atriocaval junction and the catheter is aspirated and flushes easily, is secured with 0 silk. It is packed with 5,000 units of heparin per lumen and the counterincision overlying the previous catheter cuff site is closed with a 4-0 Monocryl and then Dermabond. Sterile dressing is applied. The patient tolerated the procedure well and there were no immediate complications.     ____________________________ Renford DillsGregory G. Schnier, MD ggs:es D: 02/05/2013 11:11:36 ET T: 02/05/2013 11:17:33 ET JOB#: [id number]  cc: Renford DillsGregory G. Schnier, MD, <Dictator>

## 2015-01-29 NOTE — Consult Note (Signed)
PATIENT NAME:  Alec Cox, Alec S MR#:  [id number] DATE OF BIRTH:  Apr 16, 1951  DATE OF CONSULTATION:  12/12/2012  REFERRING PHYSICIAN:  Marland McalpineSheikh A. Ellsworth Lennoxejan-Sie, MD CONSULTING PHYSICIAN:  Rosalyn GessMichael E. Blocker, MD  REASON FOR CONSULTATION:  Possible sepsis.   HISTORY OF PRESENT ILLNESS:  The patient is a 64 year old male with a past history significant for end-stage renal disease on peritoneal dialysis, chronic hepatitis C infection with cryoglobulins and right third finger gangrene who was admitted on 12/10/2012 with diarrhea and weakness. The patient states that his diarrhea started a week or two ago. Initially, he had some chills associated with the diarrhea, but no fevers or sweats. He has not had significant nausea or vomiting, but has not been eating and drinking as much as he thinks he needs to compared to how much diarrhea he has had. He has also been performing dialysis regularly. He was found to be hypotensive in the Emergency Room and was given IV fluids with good response. He had peritoneal fluid sent which showed 117 white cells with only 20% neutrophils. Blood and peritoneal cultures are negative. He was started on vancomycin and Zosyn. He has also been started on entecavir by nephrology. Clinically, he is doing fairly well. He has not had any diarrhea since his admission.   ALLERGIES:  None.   PAST MEDICAL HISTORY:   1.  End-stage renal disease on peritoneal dialysis status post renal transplant in 2008 with subsequent failure.  2.  Hypertension.  3.  Chronic hepatitis C infection with cryoglobulinemia.  4.  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  5.  Gastroparesis.  6.  Right third finger gangrene which has been present for approximately 5 months.   SOCIAL HISTORY:  The patient lives by himself. He does not smoke. He does not drink. No injecting drug use history.   FAMILY HISTORY:  Positive for end-stage renal disease in his father.   REVIEW OF SYSTEMS:  GENERAL: No fevers.  Positive chills. No sweats. Some malaise.  HEENT: No headaches. No sinus congestion. No sore throat.  NECK: No stiffness. No swollen glands.  RESPIRATORY: No cough or shortness of breath. No sputum production.  CARDIAC: No chest pains or palpitations.  GASTROINTESTINAL: No nausea, no vomiting, no abdominal pain. Positive diarrhea for 1 to 2 weeks prior to admission. This has resolved since being in the hospital.  GENITOURINARY: The patient is anuric and on dialysis.  MUSCULOSKELETAL: No complaints.  SKIN: No rashes.  NEUROLOGIC: He did have generalized weakness, however.  PSYCHIATRIC: No complaints. All other systems are negative.   PHYSICAL EXAMINATION: VITAL SIGNS: T-max of 98.5, T-current 98.2, pulse 92, blood pressure 109/56, 99% on room air.  GENERAL: Obese 64 year old black man in no acute distress.  HEENT: Normocephalic, atraumatic. Pupils equal, reactive to light. Extraocular motion intact. Sclerae, conjunctivae, and lids are without evidence for emboli or petechiae. Oropharynx shows no erythema or exudate. Teeth and gums are in fair condition.  NECK: Supple. Full range of motion. Midline trachea. No lymphadenopathy. No thyromegaly.  LUNGS: Clear to auscultation bilaterally. Good air movement. No focal consolidation.  HEART: Regular rate and rhythm without murmur, rub or gallop.  ABDOMEN: Soft, nontender and nondistended. No hepatosplenomegaly. PD catheter in place. No hernia is noted.  EXTREMITIES: No evidence for tenosynovitis. His right third finger was wrapped in bandages and was not directly observed.  SKIN: No rashes other than the right third finger. No stigmata of endocarditis, specifically no Janeway lesions or Osler nodes.  NEUROLOGIC: The  patient was awake and interactive moving all 4 extremities.  PSYCHIATRIC: Mood and affect appeared normal.   DIAGNOSTIC DATA:  BUN 36, creatinine 10.25, bicarbonate 21, anion gap 13. LFTs were normal with the exception of an alkaline  phosphatase of 143 on admission. White count is 5.9 with a hemoglobin 8.3, platelet count 139, ANC 4.7. On admission, his white count was 7.3. Peritoneal fluid had 117 nucleated cells, 20% of which were neutrophils. Blood cultures from admission show no growth to date. Urinalysis had greater than 500 glucose, negative nitrites, negative leukocyte esterase, 4 red cells, 11 white cells per high-powered field. Urine culture is negative. Dialysis fluid shows no growth at 36 hours. Cortisol level was 23.9. A chest x-ray showed no evidence of infiltrate.   IMPRESSION:  A 64 year old male with a history of end-stage renal disease on peritoneal dialysis, chronic hepatitis C infection with cryoglobulinemia and right third finger gangrene admitted with diarrhea and hypotension.   RECOMMENDATIONS:   1.  His hypotension responded to IV fluids. He is not currently requiring any pressors. He has not had any diarrhea since admission. His peritoneal fluid had increased number of cells, but there was not a neutrophil predominance. He does not clinically have peritonitis on exam.  2.  I suspect that he had a viral illness that led him to become dehydrated.  3.  We will wait another day for the cultures. If they remain negative, we will plan on stopping the antibiotics.  4.  He is to be transferred out of the CCU today.  5.  Given his lack of diarrhea in house, C. difficile is unlikely.  6.  He is planning on starting hepatitis C therapy in the near future and would like to transition to hemodialysis.  7.  He has had stable gangrenous changes of the right third finger for several months. He would like to explore amputation options. We will consider a surgical consultation.  8.  He was started on entecavir this hospitalization. It was not listed as one of the drugs prior to admission. There is no documentation this admission of evidence for hepatitis B infection. This is typically used to treat hepatitis B infection. We  will try and contact nephrology to determine why this was started.   This was a moderately complex infectious disease consult.   Thank you very much for involving me in this patient's care.    ____________________________ Rosalyn Gess. Blocker, MD meb:si D: 12/12/2012 15:08:00 ET T: 12/12/2012 17:28:28 ET JOB#: [id number]  cc: Rosalyn Gess. Blocker, MD, <Dictator> MICHAEL E BLOCKER MD ELECTRONICALLY SIGNED 12/16/2012 8:34

## 2015-01-29 NOTE — Discharge Summary (Signed)
PATIENT NAME:  Alec Cox, Alec Cox MR#:  [id number] DATE OF BIRTH:  02/27/1951  DATE OF ADMISSION:  05/12/2013 DATE OF DISCHARGE:  05/16/2013  PRIMARY CARE PHYSICIAN:  Dr. Estil Daftegan-Sie.   CONSULTATIONS: Dr. Shelle Ironein, GI and. Dr. Cherylann RatelLateef, nephrology.  PROCEDURES:  EGD and sigmoidoscopy.   DISCHARGE DIAGNOSES: 1.  Acute diarrhea.  2.  Hypotension.  3.  End-stage renal disease, on peritoneal dialysis.  4.  Right middle finger gangrene.  CONDITION: Stable.   CODE STATUS: FULL CODE.   HOME MEDICATIONS: Please [facility] the [facility] medication reconciliation.   DIET: Low-sodium, low-fat, low-cholesterol renal diet.   ACTIVITY: As tolerated.  FOLLOW-UP CARE: Follow up with Dr. Shelle Ironein within 1 week. [facility].   REASON FOR ADMISSION: Diarrhea and hypotension.   HOSPITAL COURSE: The patient is a 64 year old male with a history hepatitis C with cryoglobulinemia, ESRD on PD.  The patient has recent history of C. diff colitis. He was treated with vancomycin Flagyl. The patient had diarrhea for several days. He denies any fever or chills. No nausea or vomiting. For detailed history and physical examination, please refer to the admission note dictated by Dr. Cherlynn Cox. On admission and the patient'Cox temperature was 97.2, pulse 111, blood pressure 102/65.  Labs showed BUN 41, creatinine 9.1, sodium 141, potassium 3.5, glucose 109, WBC 11.7, hemoglobin 15.3.    1.  Acute diarrhea. Etiology diarrhea is unclear. [facility]. [facility]. He finished the Flagyl treatment 2 days previous to this admission and still had persistent diarrhea. We rechecked C. diff which is negative. Dr. Shelle Ironein, GI physician suggested collection about 48 hours stool and check stool culture and patient get an EGD and sigmoidoscopy which only showed duodenal nodule. Dr. Shelle Ironein did a biopsy. The patient was initially and treated with vancomycin.  Since the patient'Cox C. diff is negative, vancomycin was discontinued. The patient has a lot of diarrhea even after admission, but has had no diarrhea since yesterday.  2.  Hypotension. The patient'Cox blood pressure was on the low side which is possibly due to hypovolemia and the diarrhea. The patient was treated with IV fluids rehydration and blood pressure has been stable.  3.  End-stage renal disease. The patient has been treated with PD.   4.  History of hepatitis C with cryoglobulinemia. The patient has been on prednisone.  5.  The patient has no complaints. No diarrhea after endoscopy yesterday. The patient'Cox vital signs are stable.  He is clinically stable and will be discharged to home today.  6.  The patient also has right middle finger gangrene which he is [facility]. [facility]  7.  Discussed the patient'Cox discharge plan with the patient, case manager and nurse.   TIME SPENT: About 38 minutes.    ____________________________ Shaune PollackQing Koty Anctil, MD qc:dp D: 05/16/2013 13:56:46 ET T: 05/16/2013 15:47:31 ET JOB#: [id number]  cc: Shaune PollackQing Quency Tober, MD, <Dictator> Shaune PollackQING Tony Friscia MD ELECTRONICALLY SIGNED 05/19/2013 22:25

## 2015-01-29 NOTE — Op Note (Signed)
PATIENT NAME:  [name], Alec Cox DATE OF BIRTH:  23-Dec-1950  DATE OF PROCEDURE:  02/04/2013  PREOPERATIVE DIAGNOSIS: Endstage renal disease with functional peritoneal dialysis catheter and no longer needing PermCath.   POSTOPERATIVE DIAGNOSIS: Endstage renal disease with functional peritoneal dialysis catheter and no longer needing PermCath.   PROCEDURE PERFORMED:  Removal of left jugular PermCath.   SURGEON:  Levora DredgeGregory Schnier, MD / Rico Junkerhelsea Jaleah Lefevre, PA-C  ANESTHESIA:  Local.   ESTIMATED BLOOD LOSS:  Minimal.  INDICATION FOR PROCEDURE:  This is a 64 year old African American male with endstage renal disease.  He previously was using peritoneal dialysis catheter without issues.  He went to rehab facility and needed to do hemodialysis while he was there.  He has completed rehab and is now back at home.  He therefore no longer needs his PermCath.  Peritoneal dialysis catheter is being used without difficulty. We will therefore remove his PermCath.  DESCRIPTION OF PROCEDURE:  The patient is brought to the vascular and interventional radiology area and positioned supine.  The left neck and chest and existing catheter were sterilely prepped and draped and a sterile surgical field was created. The area was locally anesthetized copiously with 1% lidocaine.  Hemostats were used to help dissect out the cuff.  The catheter was then removed in its entirety without difficulty with gentle traction.  Pressure was held at the base of the neck. Sterile dressing was placed.  The patient tolerated the procedure well.  No complications.   ____________________________ Hoyle Sauerhelsea N. Kriss Perleberg, PA-C cnh:sb D: 02/04/2013 09:12:05 ET    T: 02/04/2013 10:11:27 ET        JOB#: [id number] cc: Hoyle Sauerhelsea N. Rainy Rothman, PA-C, <Dictator> Aliz Meritt N Analis Distler PA ELECTRONICALLY SIGNED 02/10/2013 8:13

## 2015-01-29 NOTE — H&P (Signed)
PATIENT NAME:  Alec Cox, Alec Cox MR#:  [id number] DATE OF BIRTH:  08-02-1951  DATE OF ADMISSION:  05/12/2013  PRIMARY CARE PHYSICIAN: Dr. Ellsworth Lennoxejan-Sie.   CHIEF COMPLAINT: Diarrhea and hypotension.   HISTORY OF PRESENT ILLNESS: This is a 64 year old male who presents to the hospital with diarrhea ongoing now for the past few days, progressively getting worse. The patient was here in the hospital in March of this past year for similar symptoms with sepsis secondary to acute diarrheal illness. He has been having on-and-off diarrhea for the past two months. He apparently was [facility] for [facility]. diff colitis.   He was apparently discharged on oral vancomycin, but his insurance would not cover it;  therefore, he was switched to oral Flagyl. He has finished Flagyl therapy two days ago but his diarrhea has now gotten worse in the past couple of days, as he has been going at least 10 to 12 times per day. It has been watery in nature.   He denies any fevers, chills. Denies any nausea or vomiting. Does admit to some generalized abdominal pain but no other associated symptoms. The patient came to the hospital, was noted to be hypotensive with systolic blood pressures in the 80s. Responded well to some gentle IV fluids, and he is presently hemodynamically stable, although he continues to have diarrhea. Hospitalist services were contacted for further treatment and evaluation.   REVIEW OF SYSTEMS:  CONSTITUTIONAL: No documented fever. No weight gain or weight loss.  EYES: No blurred or double vision.  ENT: No tinnitus. No postnasal drip. No redness of the oropharynx.  RESPIRATORY: No cough, no wheeze, no hemoptysis, no dyspnea.  CARDIOVASCULAR: No chest pain, no orthopnea, no palpitations or syncope.  GASTROINTESTINAL: No nausea, no vomiting. Positive diarrhea. No abdominal pain, no melena or hematochezia.  GENITOURINARY: No dysuria or hematuria.  ENDOCRINE: No polyuria or nocturia. No heat or cold  intolerance.  HEMATOLOGIC: No anemia, no bruising, no bleeding.  INTEGUMENTARY: No rashes. No lesions.  MUSCULOSKELETAL: No arthritis, no swelling, no gout.  NEUROLOGIC: No numbness or tingling. No ataxia. No seizure activity.  PSYCHIATRIC: No anxiety, no insomnia. No ADD.   PAST MEDICAL HISTORY: Consistent with hepatitis C, cryoglobulinemia, end-stage renal disease on peritoneal dialysis, recent history of Clostridium difficile colitis, chronic pain syndrome, secondary hyperparathyroidism.   ALLERGIES: No known drug allergies.   SOCIAL HISTORY: No smoking. No alcohol abuse. No illicit drug abuse. Lives by himself. He did used to use tobacco but quit five years ago. Does have a 30-pack-year smoking history.   FAMILY HISTORY: Positive for end-stage renal disease on his father'Cox side.   CURRENT MEDICATIONS: As follows: Allegra 180 mg daily as needed, Baraclude 1 mg daily, omeprazole 20 mg daily, Percocet 5/325 one to 2 tabs q.6 hours as needed, potassium 20 mEq daily, prednisone 10 mg daily, Reglan 5 mg q.i.d. with meals, Renvela 800 mg t.i.d.,   Silvadene topically to be applied daily, sodium bicarbonate 1 tab b.i.d., vitamin D2 50,000 international units weekly.   PHYSICAL EXAMINATION ON ADMISSION:  VITAL SIGNS: Temperature is 97.2. Pulse 111, respirations 18, blood pressure 102/65, saturations 98% on room air.  GENERAL: A pleasant-appearing male. No apparent distress.  HEENT: Atraumatic, normocephalic. Extraocular muscles are intact. Pupils equal, reactive to light. Sclerae anicteric. No conjunctival injection. No pharyngeal erythema.  NECK: Supple. There is no jugular venous distention. No bruits, no lymphadenopathy, no thyromegaly.  HEART: Regular rate and rhythm. No murmurs. No rubs. No clicks.  LUNGS: Clear  to auscultation bilaterally. No rales or rhonchi. No wheezes.  ABDOMEN: Soft, flat. Tender diffusely. No rebound or rigidity. Good bowel sounds. No hepatosplenomegaly appreciated. The  patient has a positive peritoneal dialysis catheter placed  with no erythema or drainage around it.  EXTREMITIES: No evidence of any cyanosis, clubbing, or peripheral edema. Has +2 pedal and radial pulses bilaterally.  NEUROLOGICAL: Alert, awake, and oriented x3 with no focal motor or sensory deficits appreciated bilaterally.  SKIN: Moist and warm with no rashes appreciated.  LYMPHATIC: No cervical or axillary lymphadenopathy.   LABORATORY: Serum glucose of 109, BUN 41, creatinine 9.1, sodium 141, potassium 3.5, chloride 104, bicarbonate 24. The patient'Cox LFTs are within normal limits. Troponin 0.09. White cell count 11.7, hemoglobin 15.3, hematocrit 46.3, platelet count of 130.   ASSESSMENT AND PLAN: This is a 64 year old male with a history of hepatitis C, cryoglobulinemia, end-stage renal disease on peritoneal dialysis, recent history of Clostridium difficile, chronic pain syndrome, who presents to the hospital with acute-on-chronic diarrhea and also with hypotension.   1.  Acute diarrhea. The exact etiology of the diarrhea is unclear. The patient was recently discharged from [facility] with a bout of C. difficile. He has finished Flagyl therapy two days ago but still continues to have persistent diarrhea. I will recheck his stool for Clostridium difficile toxin, get stool for comprehensive culture. If it is positive for Clostridium difficile,  I will place him on oral vancomycin. I will get a gastroenterology consult. If his Clostridium difficile negative, will consider starting him on some Imodium or even Lomotil if needed and follow him clinically.  2.  Hypotension. This was transient in nature and improved with IV fluid hydration here in the ER. The likely cause of the hypotension is the hypovolemia and diarrhea. He is afebrile and hemodynamically stable, unlikely to be related to sepsis. I will follow his hemodynamics. Unlikely this is related to adrenal insufficiency as his prednisone dose has not  changed recently.  3.  End-stage renal disease on peritoneal dialysis. I will go ahead and consult nephrology to help [place] manage with his chronic kidney disease.  4.  History of hepatitis C with cryoglobulinemia. The patient is on maintenance prednisone. I will continue that.  5.  Chronic pain. I will continue with his p.r.n. Percocet.   CODE STATUS: FULL CODE.   TIME SPENT ON ADMISSION: 50 minutes.    ____________________________ Rolly Pancake. Cherlynn Kaiser, MD vjs:np D: 05/12/2013 18:58:03 ET T: 05/12/2013 21:11:06 ET JOB#: [id number]  cc: Rolly Pancake. Cherlynn Kaiser, MD, <Dictator> Houston Siren MD ELECTRONICALLY SIGNED 05/20/2013 12:35

## 2015-01-29 NOTE — Op Note (Signed)
PATIENT NAME:  [name], Nemiah S MR#:  [id number] DATE OF BIRTH:  May 10, 1951  DATE OF PROCEDURE:  12/17/2012  PREOPERATIVE DIAGNOSES:  1.  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  2.  End-stage renal disease requiring hemodialysis.  3.  Superior vena cava syndrome.   POSTOPERATIVE DIAGNOSES: 1.  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  2.  End-stage renal disease requiring hemodialysis.  3.  Superior vena cava syndrome.   PROCEDURES PERFORMED:  1.  Introduction catheter into superior vena cava.  2.  Placement of a 27 cm tip to cuff Cannon tunneled catheter, left IJ approach.   SURGEON: Renford DillsGregory G Schnier, M.D.   SEDATION: Versed 4 mg plus fentanyl 150 mcg administered IV. Continuous ECG, pulse oximetry and cardiopulmonary monitoring is performed throughout the entire procedure by the interventional radiology nurse. Total sedation time was 50 minutes.   ACCESS: Left IJ.   FLUOROSCOPY TIME: 5.6 minutes.   CONTRAST USED: Isovue 5 mL.   INDICATIONS: The patient is a 64 year old gentleman, who presents for insertion of a catheter. He has known occlusion of the right central veins. In the past, left-sided catheter has been achieved. He is therefore undergoing placement from a left jugular approach. Risks and benefits were reviewed. All questions answered. The patient agrees to proceed.   DESCRIPTION OF PROCEDURE: The patient is taken to special procedures and placed in the supine position. After adequate sedation is achieved, the neck and chest wall on the left are prepped and draped in sterile fashion. Collier Flowersoban is placed. Ultrasound is then placed in a sterile sleeve. Jugular vein is identified. It appears to be echolucent and compressible indicating patency. It appears patent to the level of the clavicle as well. Therefore, 1% lidocaine is infiltrated in the soft tissues of the left chest wall and neck and access to the vein is achieved with a Seldinger needle. A J-wire is then advanced, but only  advances approximately 8 to 10 cm. Fluoroscopy demonstrates that approximately 1 cm below the level of the clavicle the wire meets resistance. Therefore, a micro sheath is opened onto the field and micro sheath is advanced over the 0.035 wire and contrast injection is then performed demonstrating occlusion of the more proximal jugular. There also appears to be occlusion of the innominate as there is non-visualization. There is a large collateral, which courses directly across the chest and then fills the right innominate.   Using a glide wire and the micro sheath, the wire is negotiated into this large collateral and subsequently down into the atrium. KMP catheter is then advanced over the wire and it is demonstrated to be in the atrium. Amplatz superstiff and KMP catheter are then negotiated into the inferior vena cava. An 11 blade is then used at the wire insertion site to create a small incision and then a pocket is bluntly dissected. Dilator is passed over the wire and subsequently the peel-away sheath. The 27 cm tip to cuff Cannon catheter is then woven over the wire and advanced through the peel-away sheath. Under fluoroscopy, the tips are in the atrium, and the wire and sheath are removed. It is then approximated down into the chest wall. Exit site is selected. A small incision is made and the tunneling device is passed subcutaneously and the catheter is then pulled through the subcutaneous tunnel. Under fluoroscopy, it is then adjusted so that the tips are in the mid atrium. The catheter is then transected. The hub is connected. Both lumens aspirate and flush easily. They are  packed with 5000 units of heparin per lumen and the catheter is then secured to the skin of the chest wall with 0 silk. The counter incision is closed with 4-0 Monocryl subcuticular and Dermabond. The patient tolerated the procedure well. There were no immediate complications.   ____________________________ Renford Dills,  MD ggs:aw D: 12/17/2012 11:44:04 ET T: 12/17/2012 12:06:29 ET JOB#: [id number]  cc: Renford Dills, MD, <Dictator> Renford Dills MD ELECTRONICALLY SIGNED 01/07/2013 17:20

## 2015-01-29 NOTE — Discharge Summary (Signed)
PATIENT NAME:  Alec Cox, Alec Cox MR#:  [id number] DATE OF BIRTH:  09-18-1951  DATE OF ADMISSION:  12/10/2012 DATE OF DISCHARGE:  12/17/2012  ADDENDUM  The patient was inadvertently not discharged yesterday. No acute interval events. All discharge medications and instructions remain unchanged.    ____________________________ Silas FloodSheikh A. Ellsworth Lennoxejan-Sie, MD sat:jm D: 12/18/2012 13:11:00 ET T: 12/18/2012 13:58:11 ET JOB#: [id number]  cc: Sheikh A. Ellsworth Lennoxejan-Sie, MD, <Dictator> Charlesetta GaribaldiSHEIKH A TEJAN-SIE MD ELECTRONICALLY SIGNED 12/24/2012 12:30

## 2015-01-31 NOTE — Op Note (Signed)
PATIENT NAME:  Alec Cox, Alec Cox MR#:  [id number] DATE OF BIRTH:  30-Sep-1951  DATE OF PROCEDURE:  11/08/2011  PREOPERATIVE DIAGNOSES:  1. Endstage renal disease.  2. Status post ventral hernia repair.  3. Previous PD catheter placement with removal with hernia repair.  4. Hypertension.  5. Left foot ulceration.   POSTOPERATIVE DIAGNOSES:  1. Endstage renal disease.  2. Status post ventral hernia repair.  3. Previous PD catheter placement with removal with hernia repair.  4. Hypertension.  5. Left foot ulceration.   PROCEDURE: Laparoscopic placement of peritoneal dialysis catheter.   SURGEON: Annice NeedyJason Cox. Akira Adelsberger, M.D.   ANESTHESIA: General.   ESTIMATED BLOOD LOSS: Minimal.   INDICATION FOR PROCEDURE: This is a 64 year old [place] male well known to me for his dialysis access. He had a previous peritoneal dialysis catheter placed at an outside institution. We had exchanged this. He subsequently had a ventral hernia repair with bowel resection.  His catheter had to be removed. This was repaired with mesh over three months ago. He is very desirous to go back to peritoneal dialysis and he is brought in today for laparoscopy with placement of catheter if it is technically feasible. Risks and benefits were discussed. Informed consent was obtained.   DESCRIPTION OF PROCEDURE: The patient was brought to the operative suite and after an adequate level of general anesthesia was attained, his abdomen was sterilely prepped and draped and a sterile surgical field was created.  An area in the left upper quadrant was selected for placement of the camera. An Optiview 10-mm camera was placed with an Optiview trocar and we inspected the abdomen. There were some bowel adhesions to the umbilical area in the midline but the pelvis was free. The right upper and lower quadrants appeared to be reasonably free from adhesions. There was no clear infection or purulence within the abdomen. Under direct laparoscopic  guidance I needle-accessed an area in the right upper quadrant, passed a wire into the pelvis, and placed a peel-away sheath. I then selected a Tenckhoff peritoneal dialysis catheter. Over a trocar through the sheath I placed this into the pelvis and hubbed the cuff near the abdominal wall fascia. I removed the sheath and trocar. A counterincision was made in the lateral abdomen on the right and the catheter was brought out through this counterincision with the cuff approximately halfway between the access incision and the counterincision, the more superficial cuff.  I then desufflated the abdomen. The catheter was in excellent location within the pelvis. We ran in 500 mL of sterile saline which flowed well and this evacuated in its entirety immediately. I then placed the appropriate distal connectors on the peritoneal dialysis catheter, closed the camera incision with 3-0 Vicryl and two 4-0 Monocryl. The access incision was closed with a single 4-0 Monocryl and a 4-0 Monocryl was placed around the catheter exit site. A sterile dressing was placed. The patient tolerated the procedure well and was taken to the recovery room in stable condition.      ____________________________ Annice NeedyJason Cox. Derin Granquist, MD jsd:bjt D: 11/08/2011 08:48:44 ET T: 11/08/2011 09:27:03 ET JOB#: [id number]  cc: Annice NeedyJason Cox. Mayur Duman, MD, <Dictator> [facility] Munsoor Lizabeth LeydenN. Lateef, MD Mosetta PigeonHarmeet Singh, MD Annice NeedyJASON Cox Jaxsun Ciampi MD ELECTRONICALLY SIGNED 11/27/2011 9:19

## 2015-01-31 NOTE — Op Note (Signed)
PATIENT NAME:  Alec Cox, Alec Cox MR#:  [id number] DATE OF BIRTH:  01-07-1951  DATE OF PROCEDURE:  12/11/2011  PREOPERATIVE DIAGNOSES:  1. End-stage renal disease with functional peritoneal dialysis catheter.  2. Status post ventral hernia repair.  3. Hypertension.   POSTOPERATIVE DIAGNOSES: 1. End-stage renal disease with functional peritoneal dialysis catheter.  2. Status post ventral hernia repair.  3. Hypertension.   PROCEDURE: Removal of left jugular PermCath.   SURGEONS: Annice NeedyJason Cox. Elfreida Heggs, MD and Philomena CourseJason Kaylor, PA-C    ANESTHESIA: Local.   ESTIMATED BLOOD LOSS: Minimal.   INDICATION FOR PROCEDURE: This is a 64 year old African American male with end-stage renal disease. He has a functional access in the form of peritoneal dialysis catheter and his PermCath can be removed.   DESCRIPTION OF PROCEDURE: The patient'Cox existing catheter and chest were sterilely prepped and draped and a sterile surgical field was created. The area was anesthetized copiously with 1% lidocaine surrounding the catheter. Hemostats were used to help dissect out the cuff. An 11 blade was used to transect the fibrous sheath connected to the cuff and then the catheter was removed in its entirety without difficulty. Pressure was held. Sterile dressing was placed. The patient tolerated the procedure well.    ____________________________ Annice NeedyJason Cox. Apostolos Blagg, MD jsd:drc D: 12/11/2011 08:53:01 ET T: 12/11/2011 09:37:27 ET JOB#: [id number] cc: Annice NeedyJason Cox. Thelmer Legler, MD, <Dictator> Annice NeedyJASON Cox Shatha Hooser MD ELECTRONICALLY SIGNED 12/13/2011 11:48
